# Patient Record
Sex: Female | Born: 1985 | Hispanic: Yes | Marital: Married | Smoking: Never smoker
Health system: Southern US, Community
[De-identification: ages and names within clinical notes are randomized; demographics above are authoritative.]

## PROBLEM LIST (undated history)

## (undated) ENCOUNTER — Emergency Department: Admission: EM | Payer: PRIVATE HEALTH INSURANCE | Source: Home / Self Care

## (undated) DIAGNOSIS — I10 Essential (primary) hypertension: Secondary | ICD-10-CM

## (undated) HISTORY — PX: NO PAST SURGERIES: SHX2092

---

## 2007-08-21 ENCOUNTER — Other Ambulatory Visit: Payer: Self-pay

## 2007-08-21 ENCOUNTER — Emergency Department: Payer: Self-pay | Admitting: Emergency Medicine

## 2008-02-07 ENCOUNTER — Emergency Department: Payer: Self-pay | Admitting: Emergency Medicine

## 2008-02-09 ENCOUNTER — Ambulatory Visit: Payer: Self-pay | Admitting: Emergency Medicine

## 2010-07-17 ENCOUNTER — Ambulatory Visit: Payer: Self-pay | Admitting: Family Medicine

## 2015-02-08 ENCOUNTER — Other Ambulatory Visit: Payer: Self-pay | Admitting: Physician Assistant

## 2015-02-08 DIAGNOSIS — B1920 Unspecified viral hepatitis C without hepatic coma: Secondary | ICD-10-CM

## 2015-02-16 ENCOUNTER — Other Ambulatory Visit: Payer: Self-pay | Admitting: Physician Assistant

## 2015-02-16 ENCOUNTER — Ambulatory Visit: Payer: No Typology Code available for payment source

## 2015-02-16 DIAGNOSIS — B1910 Unspecified viral hepatitis B without hepatic coma: Secondary | ICD-10-CM

## 2015-02-24 ENCOUNTER — Ambulatory Visit
Admission: RE | Admit: 2015-02-24 | Discharge: 2015-02-24 | Disposition: A | Discharge status: Home / Self Care | Payer: No Typology Code available for payment source | Source: Ambulatory Visit | Attending: Physician Assistant | Admitting: Physician Assistant

## 2015-02-24 DIAGNOSIS — B1910 Unspecified viral hepatitis B without hepatic coma: Secondary | ICD-10-CM | POA: Diagnosis present

## 2015-04-19 ENCOUNTER — Other Ambulatory Visit: Payer: Self-pay | Admitting: Physician Assistant

## 2015-04-19 DIAGNOSIS — M7522 Bicipital tendinitis, left shoulder: Secondary | ICD-10-CM

## 2015-04-19 DIAGNOSIS — S46812A Strain of other muscles, fascia and tendons at shoulder and upper arm level, left arm, initial encounter: Secondary | ICD-10-CM

## 2015-04-21 ENCOUNTER — Ambulatory Visit
Admission: RE | Admit: 2015-04-21 | Discharge: 2015-04-21 | Disposition: A | Discharge status: Home / Self Care | Payer: No Typology Code available for payment source | Source: Ambulatory Visit | Attending: Physician Assistant | Admitting: Physician Assistant

## 2015-04-21 DIAGNOSIS — M25512 Pain in left shoulder: Secondary | ICD-10-CM | POA: Diagnosis present

## 2015-04-21 DIAGNOSIS — M7522 Bicipital tendinitis, left shoulder: Secondary | ICD-10-CM

## 2015-04-21 DIAGNOSIS — M67814 Other specified disorders of tendon, left shoulder: Secondary | ICD-10-CM | POA: Insufficient documentation

## 2015-04-21 DIAGNOSIS — M25412 Effusion, left shoulder: Secondary | ICD-10-CM | POA: Insufficient documentation

## 2015-04-21 DIAGNOSIS — S46812A Strain of other muscles, fascia and tendons at shoulder and upper arm level, left arm, initial encounter: Secondary | ICD-10-CM

## 2016-04-26 ENCOUNTER — Other Ambulatory Visit: Payer: Self-pay | Admitting: Student

## 2016-04-26 DIAGNOSIS — M5412 Radiculopathy, cervical region: Secondary | ICD-10-CM

## 2016-05-08 ENCOUNTER — Ambulatory Visit
Admission: RE | Admit: 2016-05-08 | Discharge: 2016-05-08 | Disposition: A | Discharge status: Home / Self Care | Payer: PRIVATE HEALTH INSURANCE | Source: Ambulatory Visit | Attending: Student | Admitting: Student

## 2016-05-08 ENCOUNTER — Other Ambulatory Visit: Payer: No Typology Code available for payment source

## 2016-05-08 DIAGNOSIS — M5412 Radiculopathy, cervical region: Secondary | ICD-10-CM | POA: Insufficient documentation

## 2016-09-06 DIAGNOSIS — J111 Influenza due to unidentified influenza virus with other respiratory manifestations: Secondary | ICD-10-CM | POA: Insufficient documentation

## 2016-09-18 ENCOUNTER — Emergency Department
Admission: EM | Admit: 2016-09-18 | Discharge: 2016-09-18 | Disposition: A | Discharge status: Home / Self Care | Payer: PRIVATE HEALTH INSURANCE | Attending: Emergency Medicine | Admitting: Emergency Medicine

## 2016-09-18 ENCOUNTER — Encounter: Payer: Self-pay | Admitting: Radiology

## 2016-09-18 ENCOUNTER — Emergency Department: Payer: PRIVATE HEALTH INSURANCE

## 2016-09-18 DIAGNOSIS — R000 Tachycardia, unspecified: Secondary | ICD-10-CM | POA: Insufficient documentation

## 2016-09-18 DIAGNOSIS — E860 Dehydration: Secondary | ICD-10-CM | POA: Insufficient documentation

## 2016-09-18 DIAGNOSIS — F419 Anxiety disorder, unspecified: Secondary | ICD-10-CM | POA: Insufficient documentation

## 2016-09-18 DIAGNOSIS — R0602 Shortness of breath: Secondary | ICD-10-CM | POA: Insufficient documentation

## 2016-09-18 LAB — CHLAMYDIA/NGC RT PCR (ARMC ONLY)
Chlamydia Tr: NOT DETECTED
N gonorrhoeae: NOT DETECTED

## 2016-09-18 LAB — COMPREHENSIVE METABOLIC PANEL
ALT: 25 U/L (ref 14–54)
AST: 27 U/L (ref 15–41)
Albumin: 3.8 g/dL (ref 3.5–5.0)
Alkaline Phosphatase: 77 U/L (ref 38–126)
Anion gap: 7 (ref 5–15)
BUN: 9 mg/dL (ref 6–20)
CO2: 26 mmol/L (ref 22–32)
Calcium: 9.1 mg/dL (ref 8.9–10.3)
Chloride: 106 mmol/L (ref 101–111)
Creatinine, Ser: 0.56 mg/dL (ref 0.44–1.00)
GFR calc Af Amer: >60 mL/min (ref >60)
GFR calc non Af Amer: >60 mL/min (ref >60)
Glucose, Bld: 130 mg/dL — ABNORMAL HIGH (ref 65–99)
Potassium: 3.2 mmol/L — ABNORMAL LOW (ref 3.5–5.1)
Sodium: 139 mmol/L (ref 135–145)
Total Bilirubin: 0.5 mg/dL (ref 0.3–1.2)
Total Protein: 7 g/dL (ref 6.5–8.1)

## 2016-09-18 LAB — WET PREP, GENITAL
Clue Cells Wet Prep HPF POC: NONE SEEN
Sperm: NONE SEEN
Trich, Wet Prep: NONE SEEN
Yeast Wet Prep HPF POC: NONE SEEN

## 2016-09-18 LAB — URINALYSIS, COMPLETE (UACMP) WITH MICROSCOPIC
Bacteria, UA: NONE SEEN
Bilirubin Urine: NEGATIVE
Glucose, UA: NEGATIVE mg/dL
Hgb urine dipstick: NEGATIVE
Ketones, ur: NEGATIVE mg/dL
Nitrite: NEGATIVE
Protein, ur: NEGATIVE mg/dL
Specific Gravity, Urine: 1.001 — ABNORMAL LOW (ref 1.005–1.030)
pH: 7 (ref 5.0–8.0)

## 2016-09-18 LAB — CBC WITH DIFFERENTIAL/PLATELET
Basophils Absolute: 0 10*3/uL (ref 0–0.1)
Basophils Relative: 0 %
Eosinophils Absolute: 0.1 10*3/uL (ref 0–0.7)
Eosinophils Relative: 1 %
HCT: 40 % (ref 35.0–47.0)
Hemoglobin: 13.6 g/dL (ref 12.0–16.0)
Lymphocytes Relative: 8 %
Lymphs Abs: 0.9 10*3/uL — ABNORMAL LOW (ref 1.0–3.6)
MCH: 27.7 pg (ref 26.0–34.0)
MCHC: 34 g/dL (ref 32.0–36.0)
MCV: 81.4 fL (ref 80.0–100.0)
Monocytes Absolute: 0.7 10*3/uL (ref 0.2–0.9)
Monocytes Relative: 6 %
Neutro Abs: 10.2 10*3/uL — ABNORMAL HIGH (ref 1.4–6.5)
Neutrophils Relative %: 85 %
Platelets: 348 10*3/uL (ref 150–440)
RBC: 4.91 MIL/uL (ref 3.80–5.20)
RDW: 13.2 % (ref 11.5–14.5)
WBC: 12 10*3/uL — ABNORMAL HIGH (ref 3.6–11.0)

## 2016-09-18 LAB — POCT PREGNANCY, URINE: Preg Test, Ur: NEGATIVE

## 2016-09-18 LAB — TROPONIN I: Troponin I: <0.03 ng/mL (ref <0.03)

## 2016-09-18 MED ORDER — LORAZEPAM 2 MG/ML IJ SOLN
1.0000 mg | Freq: Once | INTRAMUSCULAR | Status: AC
  Administered 2016-09-18: 1 mg via INTRAVENOUS
  Filled 2016-09-18: qty 1

## 2016-09-18 MED ORDER — LORAZEPAM 1 MG PO TABS
ORAL_TABLET | ORAL | Status: AC
  Filled 2016-09-18: qty 1

## 2016-09-18 MED ORDER — POTASSIUM CHLORIDE CRYS ER 20 MEQ PO TBCR
40.0000 meq | EXTENDED_RELEASE_TABLET | Freq: Once | ORAL | Status: AC
  Administered 2016-09-18: 40 meq via ORAL
  Filled 2016-09-18: qty 2

## 2016-09-18 MED ORDER — LORAZEPAM 1 MG PO TABS
1.0000 mg | ORAL_TABLET | Freq: Once | ORAL | Status: AC
  Administered 2016-09-18: 1 mg via ORAL

## 2016-09-18 MED ORDER — IOPAMIDOL (ISOVUE-370) INJECTION 76%
75.0000 mL | Freq: Once | INTRAVENOUS | Status: AC | PRN
  Administered 2016-09-18: 75 mL via INTRAVENOUS

## 2016-09-18 NOTE — ED Notes (Signed)
Pt presents with sob 2 weeks after being diagnosed with flu. Pt states her HR was high at [facility] when she was diagnosed and she was given 4 bags of fluids for dehydration. Pt states she is now having LLQ pain and feels like her heart is racing. Pt reports n/v x 3 days and that she has not been eating; po fluids are staying down. Pt also c/o shoulder pain and has recent diagnosis of tendonitis.

## 2016-09-18 NOTE — ED Notes (Signed)
Pt discharged home after verbalizing understanding of discharge instructions; nad noted. Interpreter [place] provided service for this discharge.;

## 2016-09-18 NOTE — ED Triage Notes (Signed)
Patient to ER with c/o shortness of breath. Patient was diagnosed with flu 2 weeks ago. Reports increased weakness currently. Also c/o left shoulder "heaviness". Also received diagnosis of tendonitis in that area recently.

## 2016-09-18 NOTE — ED Provider Notes (Addendum)
[facility] Emergency Department Provider Note ____________________________________________   I have reviewed the triage vital signs and the triage nursing note.  HISTORY  Chief Complaint Shortness of Breath   Historian Patient  HPI Darien Ramusna Edman CircleCruz Diaz is a 31 y.o. female presents today with heart racing.  She states that 2 days ago she was vomiting all day long, one episode of vomiting yesterday, she has persistent heart racing. She has some mild shortness of breath, and it feels like when she was diagnosed with dehydration a few weeks ago. At that point she was diagnosed with the flu and required 4 L of fluid. She feels like her respiratory symptoms are actually much improved, although she does still have some mild shortness of breath associated with the sensation of heart racing.  She is also complaining of midepigastric pain as well as lower suprapubic pain. She is having some frequency and mild burning with urination and is concerned she might have a urinary tract infection. She has no back pain.    History reviewed. No pertinent past medical history. Recent diagnosed for flu 2 weeks ago.  There are no active problems to display for this patient.   No past surgical history on file.  Prior to Admission medications   Not on File    Not on File  No family history on file.  Social History Social History  Substance Use Topics  . Smoking status: Not on file  . Smokeless tobacco: Not on file  . Alcohol use Not on file    Review of Systems  Constitutional: Negative for fever. Eyes: Negative for visual changes. ENT: Negative for sore throat. Cardiovascular: Some chest pressure and feeling of palpitations. Respiratory: No shortness of breath, no pleuritic chest pain, positive for palpitations/heart racing. Gastrointestinal: As per history of present illness. No hematemesis, no bloody stool. Genitourinary: Negative for hematuria. Musculoskeletal:  Negative for back pain. Skin: Negative for rash. Neurological: Negative for headache. 10 point Review of Systems otherwise negative ____________________________________________   PHYSICAL EXAM:  VITAL SIGNS: ED Triage Vitals [09/18/16 1244]  Enc Vitals Group     BP 128/83     Pulse Rate (!) 134     Resp 14     Temp 98.1 F (36.7 C)     Temp Source Oral     SpO2 99 %     Weight 170 lb (77.1 kg)     Height      Head Circumference      Peak Flow      Pain Score      Pain Loc      Pain Edu?      Excl. in GC?      Constitutional: Alert and oriented. Lying her side, also 2 cm well, but no acute distress. HEENT   Head: Normocephalic and atraumatic.      Eyes: Conjunctivae are normal. PERRL. Normal extraocular movements.      Ears:         Nose: No congestion/rhinnorhea.   Mouth/Throat: Mucous membranes are mildly dry.   Neck: No stridor. Cardiovascular/Chest:Tachycardic, regular rhythm.  No murmurs, rubs, or gallops. Respiratory: Normal respiratory effort without tachypnea nor retractions. Breath sounds are clear and equal bilaterally. No wheezes/rales/rhonchi. Gastrointestinal: Soft. No distention, no guarding, no rebound. Mild tenderness in the epigastrium and mild suprapubic tenderness  Genitourinary/rectal: No bleeding or discharge. Nontender cervix. Nontender adnexa and pelvis. Musculoskeletal: Nontender with normal range of motion in all extremities. No joint effusions.  No lower  extremity tenderness.  No edema. Neurologic:  Normal speech and language. No gross or focal neurologic deficits are appreciated. Skin:  Skin is warm, dry and intact. No rash noted. Psychiatric: Mood and affect are normal. Speech and behavior are normal. Patient exhibits appropriate insight and judgment.   ____________________________________________  LABS (pertinent positives/negatives)  Labs Reviewed  WET PREP, GENITAL - Abnormal; Notable for the following:       Result Value    WBC, Wet Prep HPF POC FEW (*)    All other components within normal limits  COMPREHENSIVE METABOLIC PANEL - Abnormal; Notable for the following:    Potassium 3.2 (*)    Glucose, Bld 130 (*)    All other components within normal limits  CBC WITH DIFFERENTIAL/PLATELET - Abnormal; Notable for the following:    WBC 12.0 (*)    Neutro Abs 10.2 (*)    Lymphs Abs 0.9 (*)    All other components within normal limits  URINALYSIS, COMPLETE (UACMP) WITH MICROSCOPIC - Abnormal; Notable for the following:    Color, Urine COLORLESS (*)    APPearance CLEAR (*)    Specific Gravity, Urine 1.001 (*)    Leukocytes, UA TRACE (*)    Squamous Epithelial / LPF 0-5 (*)    All other components within normal limits  URINE CULTURE  CULTURE, BLOOD (ROUTINE X 2)  CULTURE, BLOOD (ROUTINE X 2)  CHLAMYDIA/NGC RT PCR ([facility] ONLY)  TROPONIN I  POC URINE PREG, ED  POCT PREGNANCY, URINE    ____________________________________________    EKG I, Governor Rooks, MD, the attending physician have personally viewed and interpreted all ECGs.  135 bpm. Sinus tachycardia. Narrow QRS. Normal axis. Normal ST and T-wave  Repeat EKG 121 sinus tachycardia. Narrow QRS normal axis. Nonspecific ST-T wave ____________________________________________  RADIOLOGY All Xrays were viewed by me. Imaging interpreted by Radiologist.  Chest x-ray two-view:  IMPRESSION: Minimal bronchitic changes without infiltrate.  Chest CT for PE angio: IMPRESSION: No acute findings. No pulmonary embolism. No aortic aneurysm or dissection. No pneumonia. __________________________________________  PROCEDURES  Procedure(s) performed: None  Critical Care performed: None  ____________________________________________   ED COURSE / ASSESSMENT AND PLAN  Pertinent labs & imaging results that were available during my care of the patient were reviewed by me and considered in my medical decision making (see chart for details).   Ms.  Edman Circle is here for what appears to be clinical dehydration after vomiting for 2 days now with tachycardia. She is also reporting symptoms of UTI possibly gastritis.  Patient and family state that she is also quite anxious and stressed after family member recently diagnosed with cancer.  On reexamination, no abdominal pain, does not want further evaluation for that.  I did recommend pelvic, and no evidence for cervicitis and nontender.  Still tachycardic, discussed risk and benefit for ct chest to r/o pe -- chose to proceed.  Also patient now states she thinks anxiety /panic attack is the most likely cause was going on. I am going to give her both an IV fluid bolus, as well as Ativan.  Patient care transferred to Dr. Sharma Covert at shift change 3 PM. CT chest pending.    CONSULTATIONS:   None   Patient / Family / Caregiver informed of clinical course, medical decision-making process, and agree with plan.  Addended to include, at 3:10 AM I reviewed the chest CT which is reassuring.  Patient to receive fluids and Ativan, recheck vitals, anticipate likely discharged today.   ___________________________________________  FINAL CLINICAL IMPRESSION(S) / ED DIAGNOSES   Final diagnoses:  Dehydration  Anxiety  Sinus tachycardia              Note: This dictation was prepared with Dragon dictation. Any transcriptional errors that result from this process are unintentional    Governor Rooks, MD 09/18/16 1500    Governor Rooks, MD 09/18/16 [phone]

## 2016-09-19 ENCOUNTER — Encounter: Payer: Self-pay | Admitting: Emergency Medicine

## 2016-09-20 LAB — URINE CULTURE: Culture: NO GROWTH

## 2016-09-23 LAB — CULTURE, BLOOD (ROUTINE X 2)
Culture: NO GROWTH
Culture: NO GROWTH

## 2017-06-09 DIAGNOSIS — M7522 Bicipital tendinitis, left shoulder: Secondary | ICD-10-CM | POA: Insufficient documentation

## 2017-06-09 DIAGNOSIS — M7582 Other shoulder lesions, left shoulder: Secondary | ICD-10-CM | POA: Insufficient documentation

## 2017-06-10 ENCOUNTER — Other Ambulatory Visit: Payer: Self-pay | Admitting: Surgery

## 2017-06-12 ENCOUNTER — Other Ambulatory Visit: Payer: Self-pay | Admitting: Surgery

## 2017-06-12 DIAGNOSIS — M7582 Other shoulder lesions, left shoulder: Secondary | ICD-10-CM

## 2017-06-12 DIAGNOSIS — M7522 Bicipital tendinitis, left shoulder: Secondary | ICD-10-CM

## 2017-06-19 ENCOUNTER — Ambulatory Visit
Admission: RE | Admit: 2017-06-19 | Discharge: 2017-06-19 | Disposition: A | Discharge status: Home / Self Care | Payer: BLUE CROSS/BLUE SHIELD | Source: Ambulatory Visit | Attending: Surgery | Admitting: Surgery

## 2017-06-19 ENCOUNTER — Encounter: Payer: Self-pay | Admitting: Surgery

## 2017-06-19 DIAGNOSIS — M7582 Other shoulder lesions, left shoulder: Secondary | ICD-10-CM | POA: Insufficient documentation

## 2017-06-19 DIAGNOSIS — M7522 Bicipital tendinitis, left shoulder: Secondary | ICD-10-CM | POA: Insufficient documentation

## 2017-06-19 MED ORDER — IOPAMIDOL (ISOVUE-200) INJECTION 41%
15.0000 mL | Freq: Once | INTRAVENOUS | Status: AC | PRN
  Administered 2017-06-19: 15 mL
  Filled 2017-06-19: qty 50

## 2017-06-19 MED ORDER — LIDOCAINE HCL (PF) 1 % IJ SOLN
10.0000 mL | Freq: Once | INTRAMUSCULAR | Status: DC
  Filled 2017-06-19: qty 10

## 2017-06-19 MED ORDER — SODIUM CHLORIDE 0.9 % IJ SOLN: 10.0000 mL | Freq: Once | INTRAMUSCULAR | Status: DC

## 2017-06-19 MED ORDER — GADOBENATE DIMEGLUMINE 529 MG/ML IV SOLN
0.0500 mL | Freq: Once | INTRAVENOUS | Status: AC | PRN
  Administered 2017-06-19: 0.05 mL via INTRA_ARTICULAR

## 2017-06-23 DIAGNOSIS — S46912A Strain of unspecified muscle, fascia and tendon at shoulder and upper arm level, left arm, initial encounter: Secondary | ICD-10-CM | POA: Insufficient documentation

## 2017-08-08 ENCOUNTER — Encounter: Payer: Self-pay | Admitting: Emergency Medicine

## 2017-08-08 ENCOUNTER — Emergency Department: Payer: BLUE CROSS/BLUE SHIELD

## 2017-08-08 ENCOUNTER — Emergency Department
Admission: EM | Admit: 2017-08-08 | Discharge: 2017-08-08 | Disposition: A | Discharge status: Home / Self Care | Payer: BLUE CROSS/BLUE SHIELD | Attending: Emergency Medicine | Admitting: Emergency Medicine

## 2017-08-08 DIAGNOSIS — R05 Cough: Secondary | ICD-10-CM | POA: Diagnosis not present

## 2017-08-08 DIAGNOSIS — J040 Acute laryngitis: Secondary | ICD-10-CM | POA: Diagnosis not present

## 2017-08-08 DIAGNOSIS — R002 Palpitations: Secondary | ICD-10-CM | POA: Diagnosis not present

## 2017-08-08 DIAGNOSIS — J029 Acute pharyngitis, unspecified: Secondary | ICD-10-CM | POA: Diagnosis present

## 2017-08-08 DIAGNOSIS — R059 Cough, unspecified: Secondary | ICD-10-CM

## 2017-08-08 LAB — BASIC METABOLIC PANEL
Anion gap: 9 (ref 5–15)
BUN: 11 mg/dL (ref 6–20)
CO2: 25 mmol/L (ref 22–32)
Calcium: 9.2 mg/dL (ref 8.9–10.3)
Chloride: 106 mmol/L (ref 101–111)
Creatinine, Ser: 0.82 mg/dL (ref 0.44–1.00)
GFR calc Af Amer: >60 mL/min (ref >60)
GFR calc non Af Amer: >60 mL/min (ref >60)
Glucose, Bld: 130 mg/dL — ABNORMAL HIGH (ref 65–99)
Potassium: 3.6 mmol/L (ref 3.5–5.1)
Sodium: 140 mmol/L (ref 135–145)

## 2017-08-08 LAB — CBC
HCT: 39.2 % (ref 35.0–47.0)
Hemoglobin: 13 g/dL (ref 12.0–16.0)
MCH: 27.5 pg (ref 26.0–34.0)
MCHC: 33.2 g/dL (ref 32.0–36.0)
MCV: 83 fL (ref 80.0–100.0)
Platelets: 354 10*3/uL (ref 150–440)
RBC: 4.72 MIL/uL (ref 3.80–5.20)
RDW: 13.5 % (ref 11.5–14.5)
WBC: 12.7 10*3/uL — ABNORMAL HIGH (ref 3.6–11.0)

## 2017-08-08 LAB — TROPONIN I: Troponin I: <0.03 ng/mL (ref <0.03)

## 2017-08-08 MED ORDER — BENZONATATE 100 MG PO CAPS: 100.0000 mg | ORAL_CAPSULE | Freq: Four times a day (QID) | ORAL | 0 refills | Status: DC | PRN

## 2017-08-08 MED ORDER — ALBUTEROL SULFATE HFA 108 (90 BASE) MCG/ACT IN AERS: 2.0000 | INHALATION_SPRAY | RESPIRATORY_TRACT | 0 refills | Status: DC | PRN

## 2017-08-08 NOTE — ED Provider Notes (Signed)
[facility] Emergency Department Provider Note  ____________________________________________  Time seen: Approximately 5:39 PM  I have reviewed the triage vital signs and the nursing notes.   HISTORY  Chief Complaint Chest Pain    HPI Theresa Stevens is a 31 y.o. female, otherwise healthy, presenting with dry cough, sore throat, laryngitis and palpitations.  The patient reports that for the last several days, she has had a dry cough without any congestion or rhinorrhea, or ear pain.  She has developed a sore throat from her excessive cough.  She also reports that when she ambulates, she feels short of breath and developed some palpitations with chest tightness.  She has tried warm tea for her symptoms, which has not significantly helped.   History reviewed. No pertinent past medical history.  There are no active problems to display for this patient.   History reviewed. No pertinent surgical history.  Current Outpatient Rx  . Order #: [id number] Class: Print  . Order #: [id number] Class: Print    Allergies Patient has no known allergies.  No family history on file.  Social History Social History   Tobacco Use  . Smoking status: Never Smoker  . Smokeless tobacco: Never Used  Substance Use Topics  . Alcohol use: No  . Drug use: No    Review of Systems Constitutional: No fever/chills.  No lightheadedness or syncope. Eyes: No visual changes.  No eye discharge. ENT: Positive sore throat. No congestion or rhinorrhea. Cardiovascular: Positive chest tightness. Denies palpitations. Respiratory: Positive shortness of breath with exertion.  Positive nonproductive cough. Gastrointestinal: No abdominal pain.  No nausea, no vomiting.  No diarrhea.  No constipation. Genitourinary: Negative for dysuria. Musculoskeletal: Negative for back pain.  No lower extremity swelling or calf pain. Skin: Negative for rash. Neurological: Negative for headaches. No focal  numbness, tingling or weakness.     ____________________________________________   PHYSICAL EXAM:  VITAL SIGNS: ED Triage Vitals  Enc Vitals Group     BP 08/08/17 1632 109/75     Pulse Rate 08/08/17 1632 (!) 118     Resp 08/08/17 1632 20     Temp 08/08/17 1632 98.1 F (36.7 C)     Temp Source 08/08/17 1632 Oral     SpO2 08/08/17 1632 98 %     Weight 08/08/17 1633 185 lb (83.9 kg)     Height 08/08/17 1633 5\' 1"  (1.549 m)     Head Circumference --      Peak Flow --      Pain Score 08/08/17 1632 8     Pain Loc --      Pain Edu? --      Excl. in GC? --     Constitutional: Alert and oriented. Well appearing and in no acute distress. Answers questions appropriately.  The patient does have decreased phonation consistent with laryngitis. Eyes: Conjunctivae are normal.  EOMI. No scleral icterus.  No eye discharge. EARS: TMs are without fluid, bulge, or erythema.  The canals are clear as well. Head: Atraumatic. Nose: No congestion/rhinnorhea. Mouth/Throat: Mucous membranes are moist.  No posterior pharyngeal erythema, tonsillar swelling or exudate.  The posterior palate is symmetric and the uvula is midline.  No drooling.  Neck: No stridor.  Supple.  No JVD.  No meningismus. Cardiovascular: Normal rate, regular rhythm. No murmurs, rubs or gallops.  Respiratory: Normal respiratory effort.  No accessory muscle use or retractions. Lungs CTAB.  No wheezes, rales or ronchi. Gastrointestinal: Soft, nontender and nondistended.  No guarding  or rebound.  No peritoneal signs. Musculoskeletal: No LE edema. No ttp in the calves or palpable cords.  Negative Homan's sign. Neurologic:  A&Ox3.  Speech is clear.  Face and smile are symmetric.  EOMI.  Moves all extremities well. Skin:  Skin is warm, dry and intact. No rash noted. Psychiatric: Mood and affect are normal. Speech and behavior are normal.  Normal judgement.  ____________________________________________   LABS (all labs ordered are  listed, but only abnormal results are displayed)  Labs Reviewed  BASIC METABOLIC PANEL - Abnormal; Notable for the following components:      Result Value   Glucose, Bld 130 (*)    All other components within normal limits  CBC - Abnormal; Notable for the following components:   WBC 12.7 (*)    All other components within normal limits  TROPONIN I  POC URINE PREG, ED   ____________________________________________  EKG  ED ECG REPORT I, Rockne MenghiniNorman, Anne-Caroline, the attending physician, personally viewed and interpreted this ECG.   Date: 08/08/2017  EKG Time: 1627  Rate: 110  Rhythm: sinus tachycardia  Axis: normal  Intervals:none  ST&T Change: No STEMI  ____________________________________________  RADIOLOGY  Dg Chest 2 View  Result Date: 08/08/2017 CLINICAL DATA:  Coughing, chest pain, and shortness of breath. EXAM: CHEST  2 VIEW COMPARISON:  Chest x-ray dated September 18, 2016. FINDINGS: The heart size and mediastinal contours are within normal limits. Both lungs are clear. The visualized skeletal structures are unremarkable. IMPRESSION: No active cardiopulmonary disease. Electronically Signed   By: Obie DredgeWilliam T Derry M.D.   On: 08/08/2017 17:01    ____________________________________________   PROCEDURES  Procedure(s) performed: None  Procedures  Critical Care performed: No ____________________________________________   INITIAL IMPRESSION / ASSESSMENT AND PLAN / ED COURSE  Pertinent labs & imaging results that were available during my care of the patient were reviewed by me and considered in my medical decision making (see chart for details).  31 y.o. female with several days of dry cough, resulting sore throat, with exertional chest tightness, shortness of breath and palpitations.  Overall, the patient does have sinus tachycardia on her EKG and will recheck her heart rate here.  I do not see any evidence of a life-threatening arrhythmia.  The patient has normal  pulmonary examination and no evidence of pneumonia on her chest x-ray.  Today, the patient's symptoms are most likely from a laryngitis, and respiratory infection, which are most likely viral.  I will plan to treat her symptomatically, with Tessalon Perles and an albuterol MDI that she can have her coughing spasms.  And acute cardiac emergency, including ACS or MI, is very unlikely.  At this time, I have discussed return precautions as well as follow-up instructions with the patient who demonstrates understanding.    ____________________________________________  FINAL CLINICAL IMPRESSION(S) / ED DIAGNOSES  Final diagnoses:  Laryngitis  Cough  Sore throat  Palpitations         NEW MEDICATIONS STARTED DURING THIS VISIT:  This SmartLink is deprecated. Use AVSMEDLIST instead to display the medication list for a patient.    Rockne MenghiniNorman, Anne-Caroline, MD 08/08/17 1744

## 2017-08-08 NOTE — ED Triage Notes (Signed)
Patient presents to ED via POV from home with c/o CP and cough x 3 days. Patient also reports palpitations. Denies nausea. Dry, non productive cough noted.

## 2017-08-08 NOTE — Discharge Instructions (Signed)
Ud. Puede tomar las pastillas de Tessalon Perles o el Albuterol cuando tiene tos.  Tome Tylenol or Motrin para dolor.  Vuelva a la sala de emergencia para dolor, dificultad con su respiracion, dolor del pecho, o por cualquier otra cosa que BJ'sle moleste.

## 2017-08-08 NOTE — ED Notes (Signed)
NAD noted at time of D/C. Pt denies questions or concerns. Pt ambulatory to the lobby at this time.  

## 2018-06-08 ENCOUNTER — Encounter (INDEPENDENT_AMBULATORY_CARE_PROVIDER_SITE_OTHER): Payer: BLUE CROSS/BLUE SHIELD | Admitting: Vascular Surgery

## 2018-06-16 ENCOUNTER — Encounter (INDEPENDENT_AMBULATORY_CARE_PROVIDER_SITE_OTHER): Payer: BLUE CROSS/BLUE SHIELD | Admitting: Vascular Surgery

## 2018-06-24 ENCOUNTER — Encounter (INDEPENDENT_AMBULATORY_CARE_PROVIDER_SITE_OTHER): Payer: Self-pay | Admitting: Vascular Surgery

## 2018-10-28 ENCOUNTER — Other Ambulatory Visit (HOSPITAL_COMMUNITY): Payer: Self-pay | Admitting: Student

## 2018-10-28 ENCOUNTER — Other Ambulatory Visit: Payer: Self-pay | Admitting: Student

## 2018-10-28 DIAGNOSIS — B181 Chronic viral hepatitis B without delta-agent: Secondary | ICD-10-CM

## 2018-11-03 ENCOUNTER — Ambulatory Visit
Admission: RE | Admit: 2018-11-03 | Discharge: 2018-11-03 | Disposition: A | Discharge status: Home / Self Care | Payer: BLUE CROSS/BLUE SHIELD | Source: Ambulatory Visit | Attending: Student | Admitting: Student

## 2018-11-03 ENCOUNTER — Other Ambulatory Visit: Payer: Self-pay

## 2018-11-03 DIAGNOSIS — B181 Chronic viral hepatitis B without delta-agent: Secondary | ICD-10-CM | POA: Diagnosis not present

## 2019-04-29 DIAGNOSIS — B181 Chronic viral hepatitis B without delta-agent: Secondary | ICD-10-CM | POA: Insufficient documentation

## 2019-06-22 ENCOUNTER — Other Ambulatory Visit: Payer: Self-pay | Admitting: Family Medicine

## 2019-06-22 ENCOUNTER — Ambulatory Visit
Admission: RE | Admit: 2019-06-22 | Discharge: 2019-06-22 | Disposition: A | Discharge status: Home / Self Care | Payer: BC Managed Care – PPO | Source: Ambulatory Visit | Attending: Family Medicine | Admitting: Family Medicine

## 2019-06-22 DIAGNOSIS — M25562 Pain in left knee: Secondary | ICD-10-CM

## 2019-06-22 DIAGNOSIS — W19XXXA Unspecified fall, initial encounter: Secondary | ICD-10-CM

## 2020-04-30 IMAGING — US US ABDOMEN COMPLETE
1 series · 14 of 25 positions shown · non-contrast
Comparison: Chest CTA 09/18/2016

CLINICAL DATA: 32-year-old female with chronic hepatitis B.

EXAM:
ABDOMEN ULTRASOUND COMPLETE

[Series 1: us abdomen complete · 0.19mm/px · 14 of 85 slices shown]
[im 1/85]
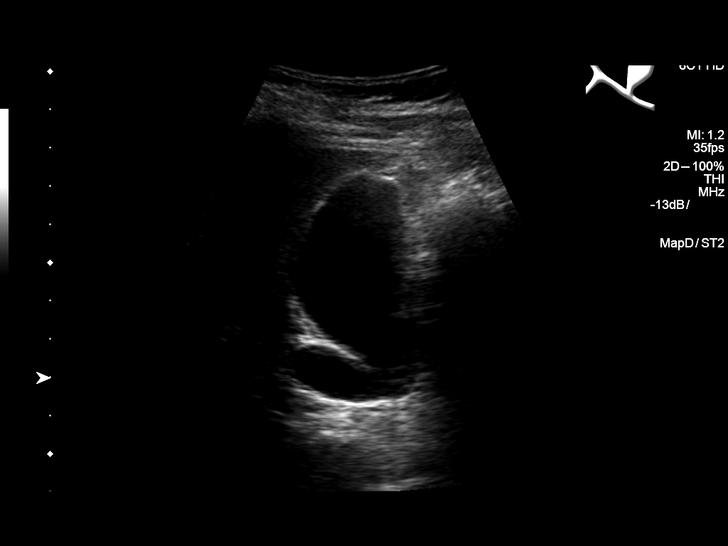
[im 8/85]
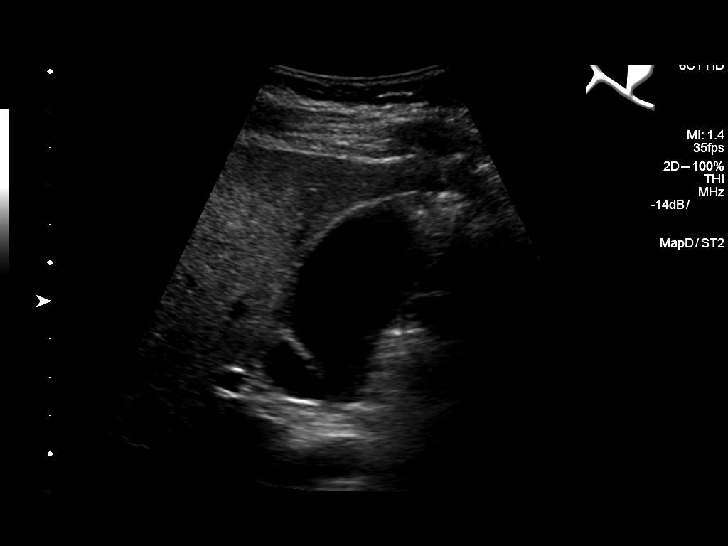
[im 15/85]
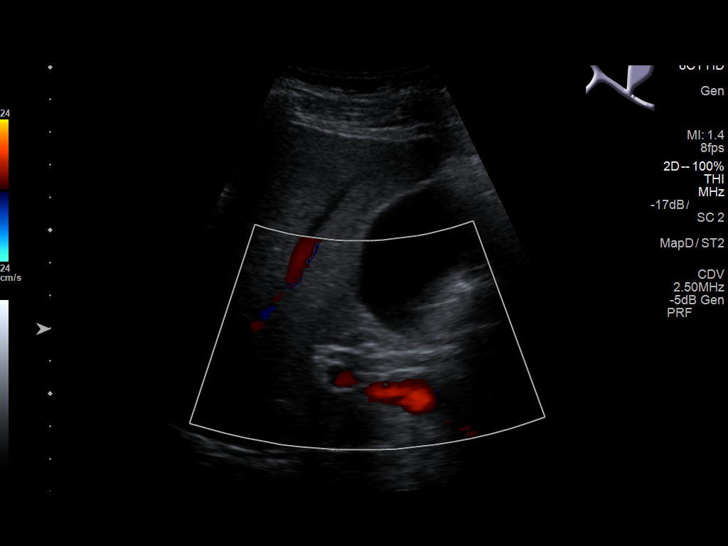
[im 22/85]
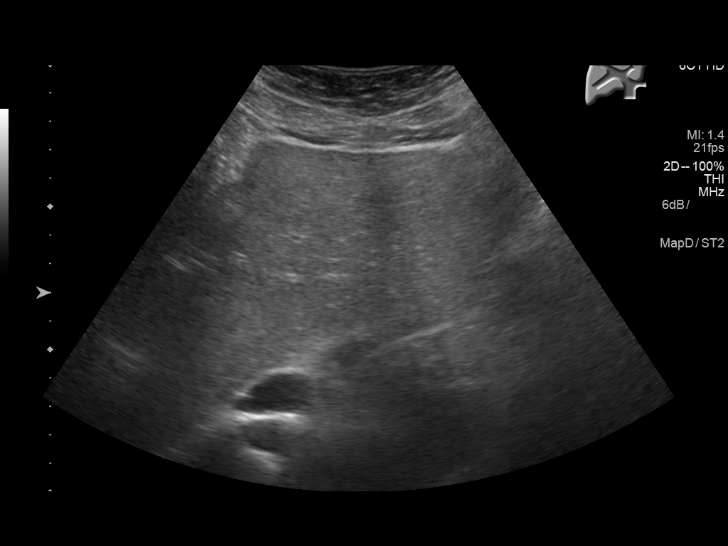
[im 29/85]
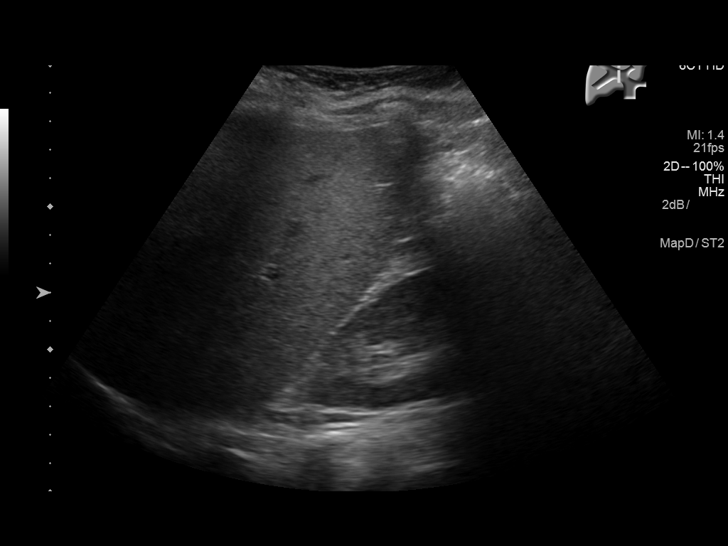
[im 32/85]
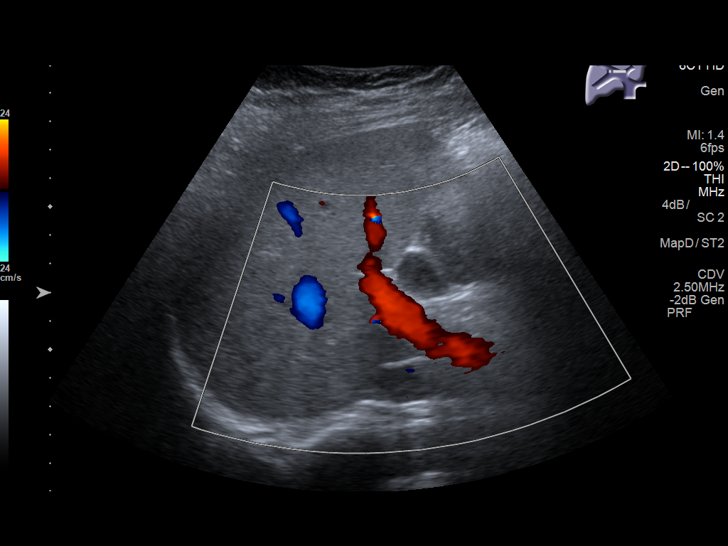
[im 39/85]
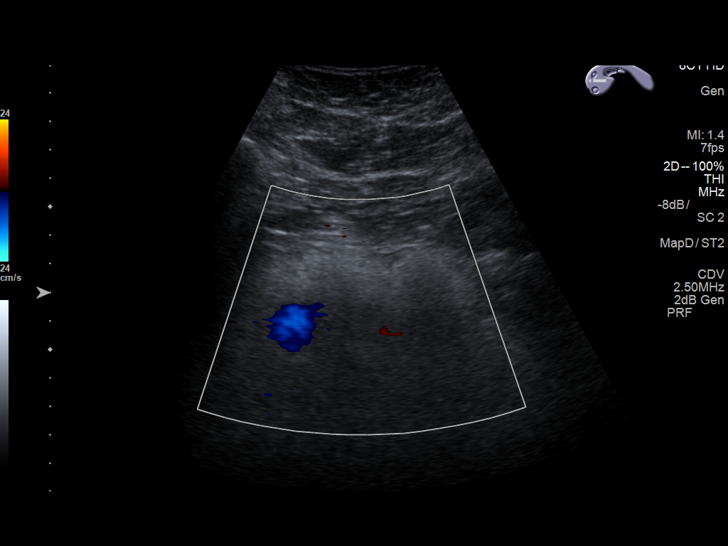
[im 46/85]
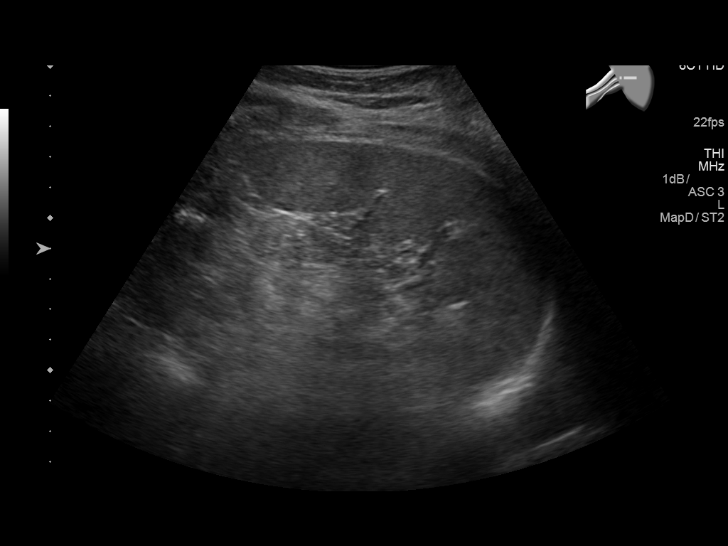
[im 53/85]
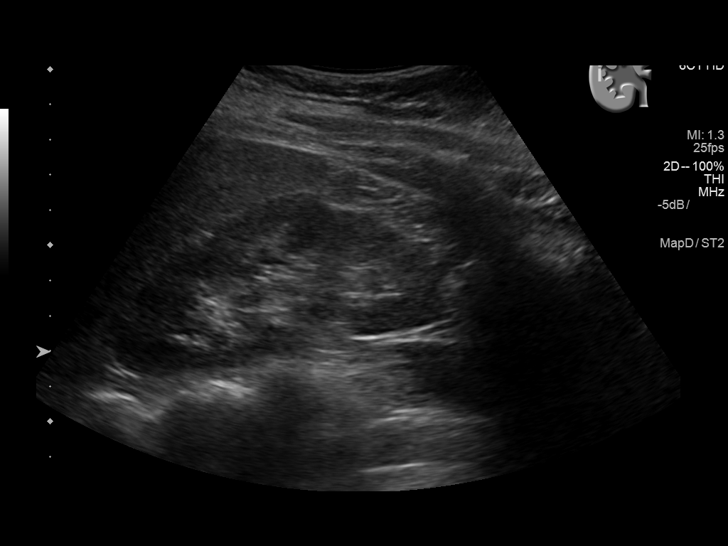
[im 57/85]
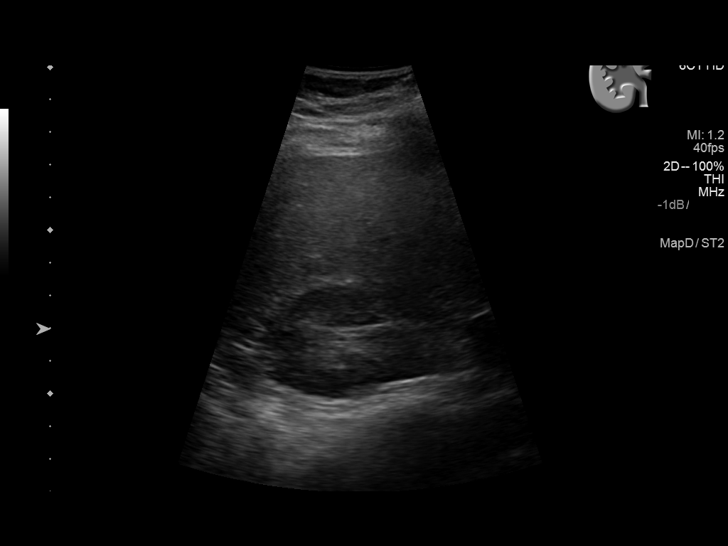
[im 64/85]
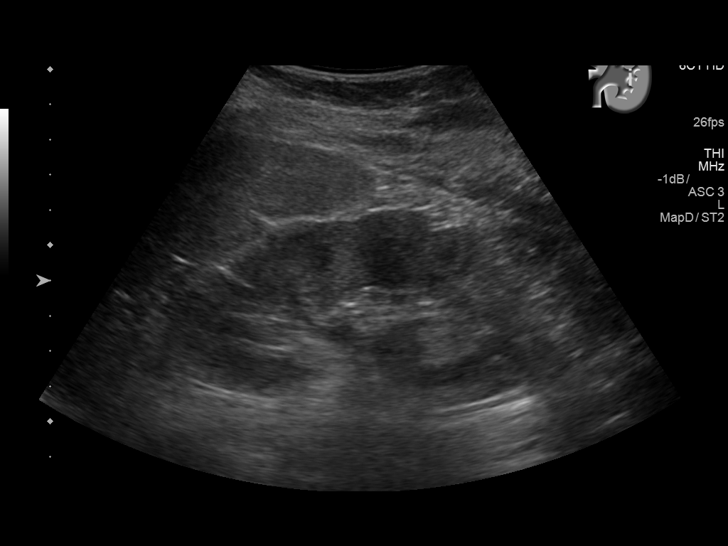
[im 71/85]
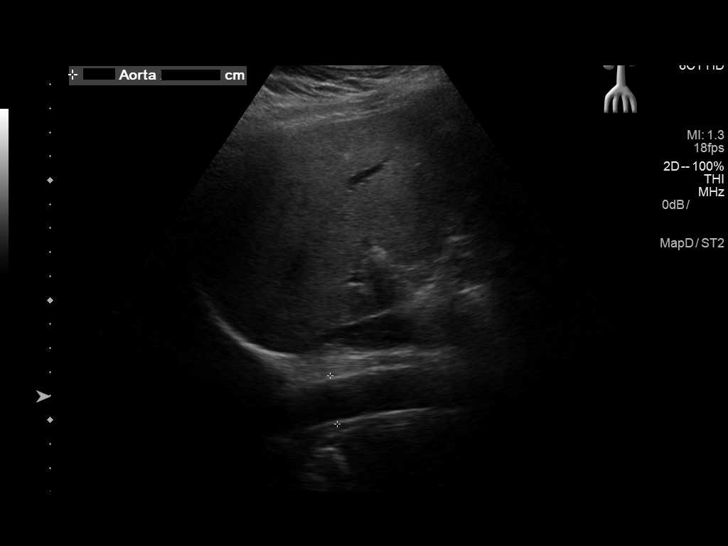
[im 78/85]
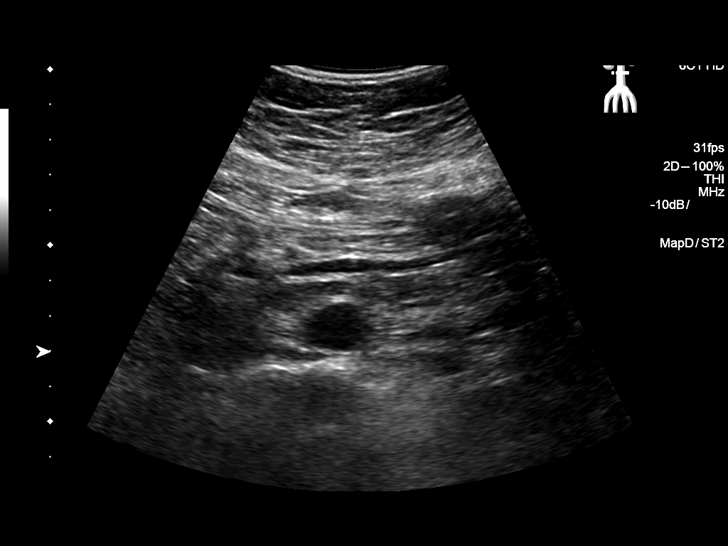
[im 85/85]
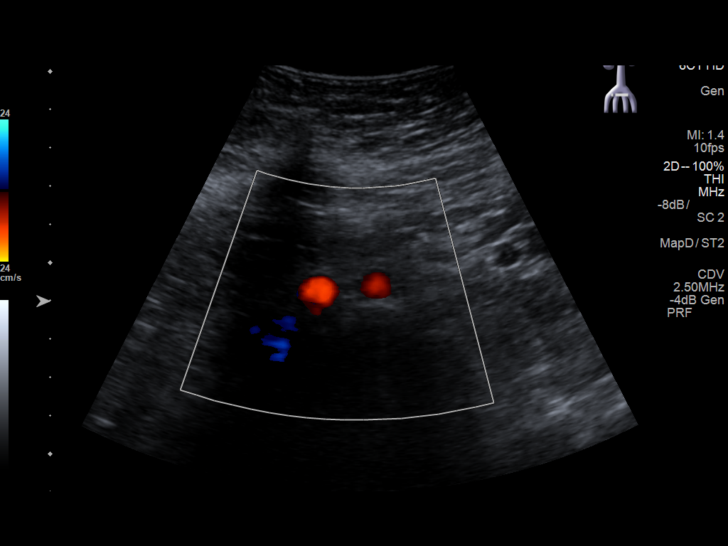

[14 of 25 positions shown; findings below may reference images not displayed]

FINDINGS: Gallbladder: No gallstones or wall thickening visualized. No
sonographic Murphy sign noted by sonographer.

Common bile duct: Diameter: 3 millimeters, normal.

Liver: Liver echogenicity is mildly increased (image 30). No
discrete liver lesion. No intrahepatic biliary ductal dilatation.
Portal vein is patent on color Doppler imaging with normal direction
of blood flow towards the liver.

IVC: No abnormality visualized.

Pancreas: Visualized portion unremarkable.

Spleen: Size and appearance within normal limits.

Right Kidney: Length: 10.4 centimeters. Echogenicity within normal
limits. No mass or hydronephrosis visualized.

Left Kidney: Length: 10.7 centimeters. Evidence of shadowing left
upper pole nephrolithiasis (image 60).. Echogenicity within normal
limits. No mass or hydronephrosis visualized.

Abdominal aorta: No aneurysm visualized.

Other findings: None.
IMPRESSION: 1. Negative ultrasound appearance of the liver aside from mildly
increased echogenicity.
2. Left nephrolithiasis suspected.

## 2020-05-01 ENCOUNTER — Other Ambulatory Visit: Payer: Self-pay | Admitting: Physician Assistant

## 2020-05-01 DIAGNOSIS — Z3482 Encounter for supervision of other normal pregnancy, second trimester: Secondary | ICD-10-CM

## 2020-05-09 ENCOUNTER — Ambulatory Visit
Admission: RE | Admit: 2020-05-09 | Discharge: 2020-05-09 | Disposition: A | Discharge status: Home / Self Care | Payer: BC Managed Care – PPO | Source: Ambulatory Visit | Attending: Physician Assistant | Admitting: Physician Assistant

## 2020-05-09 ENCOUNTER — Other Ambulatory Visit: Payer: Self-pay

## 2020-05-09 DIAGNOSIS — Z3482 Encounter for supervision of other normal pregnancy, second trimester: Secondary | ICD-10-CM

## 2020-05-29 ENCOUNTER — Other Ambulatory Visit: Payer: Self-pay | Admitting: Physician Assistant

## 2020-05-29 DIAGNOSIS — G930 Cerebral cysts: Secondary | ICD-10-CM

## 2020-06-15 ENCOUNTER — Ambulatory Visit
Admission: RE | Admit: 2020-06-15 | Discharge: 2020-06-15 | Disposition: A | Discharge status: Home / Self Care | Payer: BC Managed Care – PPO | Source: Ambulatory Visit | Attending: Physician Assistant | Admitting: Physician Assistant

## 2020-06-15 ENCOUNTER — Other Ambulatory Visit: Payer: Self-pay

## 2020-06-15 DIAGNOSIS — G930 Cerebral cysts: Secondary | ICD-10-CM | POA: Insufficient documentation

## 2021-10-23 ENCOUNTER — Other Ambulatory Visit: Payer: Self-pay | Admitting: Family Medicine

## 2021-11-01 ENCOUNTER — Other Ambulatory Visit: Payer: Self-pay | Admitting: Family Medicine

## 2021-11-01 DIAGNOSIS — I83811 Varicose veins of right lower extremities with pain: Secondary | ICD-10-CM

## 2021-11-05 ENCOUNTER — Ambulatory Visit: Payer: Medicaid Other

## 2021-11-09 ENCOUNTER — Other Ambulatory Visit: Payer: Self-pay

## 2021-11-09 ENCOUNTER — Ambulatory Visit
Admission: RE | Admit: 2021-11-09 | Discharge: 2021-11-09 | Disposition: A | Discharge status: Home / Self Care | Payer: Medicaid Other | Source: Ambulatory Visit | Attending: Family Medicine | Admitting: Family Medicine

## 2021-11-09 DIAGNOSIS — I83811 Varicose veins of right lower extremities with pain: Secondary | ICD-10-CM | POA: Diagnosis present

## 2021-11-12 ENCOUNTER — Other Ambulatory Visit: Payer: Self-pay | Admitting: Gastroenterology

## 2021-11-12 DIAGNOSIS — B181 Chronic viral hepatitis B without delta-agent: Secondary | ICD-10-CM

## 2021-11-26 ENCOUNTER — Other Ambulatory Visit: Payer: Self-pay

## 2021-11-26 ENCOUNTER — Ambulatory Visit
Admission: RE | Admit: 2021-11-26 | Discharge: 2021-11-26 | Disposition: A | Discharge status: Home / Self Care | Payer: Medicaid Other | Source: Ambulatory Visit | Attending: Gastroenterology | Admitting: Gastroenterology

## 2021-11-26 DIAGNOSIS — B181 Chronic viral hepatitis B without delta-agent: Secondary | ICD-10-CM | POA: Insufficient documentation

## 2021-12-11 ENCOUNTER — Other Ambulatory Visit (INDEPENDENT_AMBULATORY_CARE_PROVIDER_SITE_OTHER): Payer: Self-pay | Admitting: Nurse Practitioner

## 2021-12-11 DIAGNOSIS — I83811 Varicose veins of right lower extremities with pain: Secondary | ICD-10-CM

## 2021-12-12 ENCOUNTER — Encounter (INDEPENDENT_AMBULATORY_CARE_PROVIDER_SITE_OTHER): Payer: BLUE CROSS/BLUE SHIELD | Admitting: Nurse Practitioner

## 2021-12-12 ENCOUNTER — Encounter (INDEPENDENT_AMBULATORY_CARE_PROVIDER_SITE_OTHER): Payer: BLUE CROSS/BLUE SHIELD

## 2022-05-20 ENCOUNTER — Encounter (INDEPENDENT_AMBULATORY_CARE_PROVIDER_SITE_OTHER): Payer: Self-pay | Admitting: Vascular Surgery

## 2022-05-20 ENCOUNTER — Ambulatory Visit (INDEPENDENT_AMBULATORY_CARE_PROVIDER_SITE_OTHER): Payer: Medicaid Other

## 2022-05-20 ENCOUNTER — Ambulatory Visit (INDEPENDENT_AMBULATORY_CARE_PROVIDER_SITE_OTHER): Payer: Medicaid Other | Admitting: Vascular Surgery

## 2022-05-20 DIAGNOSIS — I83819 Varicose veins of unspecified lower extremities with pain: Secondary | ICD-10-CM | POA: Diagnosis not present

## 2022-05-20 DIAGNOSIS — I83811 Varicose veins of right lower extremities with pain: Secondary | ICD-10-CM

## 2022-05-22 ENCOUNTER — Encounter (INDEPENDENT_AMBULATORY_CARE_PROVIDER_SITE_OTHER): Payer: Self-pay | Admitting: Vascular Surgery

## 2022-05-22 DIAGNOSIS — I83819 Varicose veins of unspecified lower extremities with pain: Secondary | ICD-10-CM | POA: Insufficient documentation

## 2022-05-22 NOTE — Progress Notes (Signed)
MRN : [id number]  Theresa Stevens is a 36 y.o. ([birth year]) female who presents with chief complaint of varicose veins hurt.  History of Present Illness: The patient is seen for evaluation of symptomatic varicose veins. The patient relates burning and stinging which worsened steadily throughout the course of the day, particularly with standing. The patient also notes an aching and throbbing pain over the varicosities, particularly with prolonged dependent positions. The symptoms are significantly improved with elevation.  The patient also notes that during hot weather the symptoms are greatly intensified. The patient states the pain from the varicose veins interferes with work, daily exercise, shopping and household maintenance. At this point, the symptoms are persistent and severe enough that they're having a negative impact on lifestyle and are interfering with daily activities.  There is no history of DVT, PE or superficial thrombophlebitis. There is no history of ulceration or hemorrhage. The patient denies a significant family history of varicose veins.  The patient has worn graduated compression many years. She states she has been exercising  on a routine basis.  At the present time the patient has been using over-the-counter analgesics. There is no history of prior surgical intervention or sclerotherapy.    No outpatient medications have been marked as taking for the 05/20/22 encounter (Office Visit) with Delana Meyer, Dolores Lory, MD.    No past medical history on file.  Past Surgical History:  Procedure Laterality Date   NO PAST SURGERIES      Social History Social History   Tobacco Use   Smoking status: Never   Smokeless tobacco: Never  Substance Use Topics   Alcohol use: No   Drug use: No    Family History Family History  Adopted: Yes    No Known Allergies   REVIEW OF SYSTEMS (Negative unless checked)  Constitutional: [] Weight loss  [] Fever  [] Chills Cardiac:  [] Chest pain   [] Chest pressure   [] Palpitations   [] Shortness of breath when laying flat   [] Shortness of breath with exertion. Vascular:  [] Pain in legs with walking   [x] Pain in legs with standing  [] History of DVT   [] Phlebitis   [] Swelling in legs   [x] Varicose veins   [] Non-healing ulcers Pulmonary:   [] Uses home oxygen   [] Productive cough   [] Hemoptysis   [] Wheeze  [] COPD   [] Asthma Neurologic:  [] Dizziness   [] Seizures   [] History of stroke   [] History of TIA  [] Aphasia   [] Vissual changes   [] Weakness or numbness in arm   [] Weakness or numbness in leg Musculoskeletal:   [] Joint swelling   [] Joint pain   [] Low back pain Hematologic:  [] Easy bruising  [] Easy bleeding   [] Hypercoagulable state   [] Anemic Gastrointestinal:  [] Diarrhea   [] Vomiting  [] Gastroesophageal reflux/heartburn   [] Difficulty swallowing. Genitourinary:  [] Chronic kidney disease   [] Difficult urination  [] Frequent urination   [] Blood in urine Skin:  [] Rashes   [] Ulcers  Psychological:  [] History of anxiety   []  History of major depression.  Physical Examination  Vitals:   05/20/22 1453  BP: 135/85  Pulse: 99  Resp: 16  Weight: 189 lb (85.7 kg)   Body mass index is 35.71 kg/m. Gen: WD/WN, NAD Head: [place]/AT, No temporalis wasting.  Ear/Nose/Throat: Hearing grossly intact, nares w/o erythema or drainage, pinna without lesions Eyes: PER, EOMI, sclera nonicteric.  Neck: Supple, no gross masses.  No JVD.  Pulmonary:  Good air movement, no audible wheezing, no use of accessory muscles.  Cardiac:  RRR, precordium not hyperdynamic. Vascular:  Large varicosities present, greater than 10 mm right leg.  Veins are tender to palpation  Moderate venous stasis changes to the legs bilaterally.  Trace soft pitting edema  Vessel Right Left  Radial Palpable Palpable  Gastrointestinal: soft, non-distended. No guarding/no peritoneal signs.  Musculoskeletal: M/S 5/5 throughout.  No deformity.  Neurologic: CN 2-12 intact. Pain and  light touch intact in extremities.  Symmetrical.  Speech is fluent. Motor exam as listed above. Psychiatric: Judgment intact, Mood & affect appropriate for pt's clinical situation. Dermatologic: Venous rashes no ulcers noted.  No changes consistent with cellulitis. Lymph : No lichenification or skin changes of chronic lymphedema.  CBC Lab Results  Component Value Date   WBC 12.7 (H) 08/08/2017   HGB 13.0 08/08/2017   HCT 39.2 08/08/2017   MCV 83.0 08/08/2017   PLT 354 08/08/2017    BMET    Component Value Date/Time   NA 140 08/08/2017 1632   K 3.6 08/08/2017 1632   CL 106 08/08/2017 1632   CO2 25 08/08/2017 1632   GLUCOSE 130 (H) 08/08/2017 1632   BUN 11 08/08/2017 1632   CREATININE 0.82 08/08/2017 1632   CALCIUM 9.2 08/08/2017 1632   GFRNONAA >60 08/08/2017 1632   GFRAA >60 08/08/2017 1632   CrCl cannot be calculated (Patient's most recent lab result is older than the maximum 21 days allowed.).  COAG No results found for: "INR", "PROTIME"  Radiology No results found.   Assessment/Plan 1. Varicose veins of right lower extremity with pain Recommend  I have reviewed my previous  discussion with the patient regarding  varicose veins and why they cause symptoms. Patient will continue  wearing graduated compression stockings class 1 on a daily basis, beginning first thing in the morning and removing them in the evening.    In addition, behavioral modification including elevation during the day was again discussed and this will continue.  The patient has utilized over the counter pain medications and has been exercising.  However, at this time conservative therapy has not alleviated the patient's symptoms of leg pain and swelling  Recommend: laser ablation of the right great saphenous vein to eliminate the symptoms of pain and swelling of the lower extremities caused by the severe superficial venous reflux disease.   - VAS [place] LOWER EXTREMITY VENOUS REFLUX    Hortencia Pilar, MD  05/22/2022 8:46 PM

## 2022-05-29 ENCOUNTER — Ambulatory Visit
Admission: EM | Admit: 2022-05-29 | Discharge: 2022-05-29 | Disposition: A | Discharge status: Home / Self Care | Payer: PRIVATE HEALTH INSURANCE

## 2022-05-29 ENCOUNTER — Encounter: Payer: Self-pay | Admitting: Emergency Medicine

## 2022-05-29 DIAGNOSIS — M542 Cervicalgia: Secondary | ICD-10-CM | POA: Diagnosis not present

## 2022-05-29 DIAGNOSIS — R112 Nausea with vomiting, unspecified: Secondary | ICD-10-CM

## 2022-05-29 DIAGNOSIS — M62838 Other muscle spasm: Secondary | ICD-10-CM | POA: Diagnosis not present

## 2022-05-29 DIAGNOSIS — R5383 Other fatigue: Secondary | ICD-10-CM

## 2022-05-29 DIAGNOSIS — R197 Diarrhea, unspecified: Secondary | ICD-10-CM

## 2022-05-29 HISTORY — DX: Essential (primary) hypertension: I10

## 2022-05-29 MED ORDER — ONDANSETRON HCL 4 MG PO TABS: 4.0000 mg | ORAL_TABLET | Freq: Three times a day (TID) | ORAL | 0 refills | Status: DC | PRN

## 2022-05-29 MED ORDER — CYCLOBENZAPRINE HCL 10 MG PO TABS: ORAL_TABLET | ORAL | 0 refills | Status: DC

## 2022-05-29 NOTE — ED Provider Notes (Signed)
[facility]    CSN: [id number] Arrival date & time: 05/29/22  1753      History   Chief Complaint Chief Complaint  Patient presents with   Dizziness   Emesis   Diarrhea   Headache    HPI Theresa Stevens is a 36 y.o. female.   36 year old female accompanied by her husband presents with multiple concerns. First is nausea, vomiting and diarrhea that started yesterday. Also has had a headache since yesterday. Occasional abdominal cramping which has mostly resolved today. No fever, dysuria, blood in her stool or unusual vaginal discharge. No known unusual foods. No other family members ill.   2nd concern is left sided neck and shoulder pain for over 1 month. She was seen by her PCP and thought to have a muscle strain and was prescribed anti-inflammatory medication with minimal relief. Pain will travel up back of head and contributes to headaches. No numbness or loss of motor control of arms or hands.   3rd concern is fatigue and dizziness. This has been occurring for many weeks. Does have a history of anemia. Did have blood work done by her PCP to evaluate thyroid dysfunction which was negative. Denies any chest pain, palpitations or shortness of breath. Has history of HTN. Currently takes Atenolol daily. No tobacco, alcohol or illicit drug use.   The history is provided by the patient and the spouse. The history is limited by a language barrier. A language interpreter was used Tax adviser Spanish interpreter used).    Past Medical History:  Diagnosis Date   Hypertension     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/2022   Chronic hepatitis B virus infection (HCC) 04/29/2019   Muscle strain of left scapular region 06/23/2017   Tendinitis of upper biceps tendon of left shoulder 06/09/2017   Rotator cuff tendinitis, left 06/09/2017   Influenza 09/06/2016    Past Surgical History:  Procedure Laterality Date   NO PAST SURGERIES       OB History   No obstetric history on file.      Home Medications    Prior to Admission medications   Medication Sig Start Date End Date Taking? Authorizing Provider  atenolol (TENORMIN) 25 MG tablet Take by mouth daily.   Yes [provider]  cyclobenzaprine (FLEXERIL) 10 MG tablet Take 1/2 to 1 whole tablet every 8 hours as needed for muscle spasms/pain 05/29/22  Yes Donnie Panik, Ali Lowe, NP  ondansetron (ZOFRAN) 4 MG tablet Take 1 tablet (4 mg total) by mouth every 8 (eight) hours as needed for nausea or vomiting. 05/29/22  Yes Ladan Vanderzanden, Ali Lowe, NP    Family History Family History  Adopted: Yes    Social History Social History   Tobacco Use   Smoking status: Never   Smokeless tobacco: Never  Vaping Use   Vaping Use: Never used  Substance Use Topics   Alcohol use: No   Drug use: No     Allergies   Patient has no known allergies.   Review of Systems Review of Systems  Constitutional:  Positive for activity change, appetite change and fatigue. Negative for diaphoresis and fever.  HENT:  Negative for mouth sores, sore throat and trouble swallowing.   Eyes:  Negative for photophobia and visual disturbance.  Respiratory:  Negative for shortness of breath and wheezing.   Cardiovascular:  Negative for chest pain and palpitations.  Gastrointestinal:  Positive for diarrhea, nausea  and vomiting. Negative for blood in stool.  Genitourinary:  Negative for decreased urine volume, difficulty urinating, dysuria, flank pain, frequency, hematuria and vaginal discharge.  Musculoskeletal:  Positive for myalgias and neck pain. Negative for arthralgias and neck stiffness.  Skin:  Negative for color change and rash.  Allergic/Immunologic: Negative for environmental allergies, food allergies and immunocompromised state.  Neurological:  Positive for dizziness, light-headedness and headaches. Negative for tremors, seizures, syncope, facial asymmetry, speech difficulty and numbness.   Hematological:  Negative for adenopathy. Does not bruise/bleed easily.     Physical Exam Triage Vital Signs ED Triage Vitals  Enc Vitals Group     BP 05/29/22 1824 118/78     Pulse Rate 05/29/22 1824 92     Resp 05/29/22 1824 16     Temp 05/29/22 1824 98.2 F (36.8 C)     Temp Source 05/29/22 1824 Oral     SpO2 05/29/22 1824 98 %     Weight --      Height --      Head Circumference --      Peak Flow --      Pain Score 05/29/22 1822 7     Pain Loc --      Pain Edu? --      Excl. in GC? --    No data found.  Updated Vital Signs BP 118/78 (BP Location: Right Arm)   Pulse 92   Temp 98.2 F (36.8 C) (Oral)   Resp 16   LMP 05/22/2022   SpO2 98%   Visual Acuity Right Eye Distance:   Left Eye Distance:   Bilateral Distance:    Right Eye Near:   Left Eye Near:    Bilateral Near:     Physical Exam Vitals and nursing note reviewed.  Constitutional:      General: She is awake. She is not in acute distress.    Appearance: She is well-developed and overweight.     Comments: She is partially lying back in the exam chair in no acute distress but appears tired and uncomfortable due to neck pain.   HENT:     Head: Normocephalic and atraumatic.     Jaw: There is normal jaw occlusion.     Right Ear: Hearing, tympanic membrane, ear canal and external ear normal.     Left Ear: Hearing, tympanic membrane, ear canal and external ear normal.     Nose: Nose normal.     Mouth/Throat:     Lips: Pink.     Mouth: Mucous membranes are moist.     Pharynx: Oropharynx is clear. Uvula midline. No pharyngeal swelling, oropharyngeal exudate, posterior oropharyngeal erythema or uvula swelling.  Eyes:     Extraocular Movements: Extraocular movements intact.     Conjunctiva/sclera: Conjunctivae normal.     Pupils: Pupils are equal, round, and reactive to light.  Neck:      Comments: Has full range of motion of neck but pain with flexion and hyperextension. Left upper trapezius muscle  tightness and spasms present. No redness or rash. No distinct swelling. Pain and tenderness travel down almost entire left trapezius muscle group. No neuro deficits noted.  Cardiovascular:     Rate and Rhythm: Normal rate and regular rhythm.     Heart sounds: Normal heart sounds. No murmur heard. Pulmonary:     Effort: Pulmonary effort is normal. No respiratory distress.     Breath sounds: Normal breath sounds and air entry. No decreased air movement. No decreased breath  sounds, wheezing, rhonchi or rales.  Abdominal:     General: Bowel sounds are increased. There is no distension.     Palpations: Abdomen is soft. There is no hepatomegaly or splenomegaly.     Tenderness: There is generalized abdominal tenderness. There is no right CVA tenderness, left CVA tenderness, guarding or rebound.  Musculoskeletal:     Cervical back: Normal range of motion and neck supple. Tenderness present. No erythema or rigidity. Pain with movement and muscular tenderness present. Normal range of motion.  Lymphadenopathy:     Cervical: No cervical adenopathy.  Skin:    General: Skin is warm and dry.     Capillary Refill: Capillary refill takes less than 2 seconds.     Findings: No rash.  Neurological:     General: No focal deficit present.     Mental Status: She is alert and oriented to person, place, and time.     Cranial Nerves: Cranial nerves 2-12 are intact.     Sensory: Sensation is intact. No sensory deficit.     Motor: Motor function is intact.  Psychiatric:        Mood and Affect: Mood normal.        Behavior: Behavior normal. Behavior is cooperative.        Thought Content: Thought content normal.        Judgment: Judgment normal.      UC Treatments / Results  Labs (all labs ordered are listed, but only abnormal results are displayed) Labs Reviewed - No data to display  EKG   Radiology No results found.  Procedures Procedures (including critical care time)  Medications Ordered in  UC Medications - No data to display  Initial Impression / Assessment and Plan / UC Course  I have reviewed the triage vital signs and the nursing notes.  Pertinent labs & imaging results that were available during my care of the patient were reviewed by me and considered in my medical decision making (see chart for details).     Reviewed with patient that she probably has a viral GI illness. Discussed that usually symptoms resolve within 48 hours and are improving today. May take Zofran 4mg  every 8 hours as needed for nausea. May drink clear fluids today and bland foods such as toast and rice and advance diet as tolerated.  Discussed that she has muscle spasms and tightness in her trapezius muscle group. Will trial Flexeril muscle relaxer- take 10mg  at night and 1/2 to 1 whole tablet every 8 hours during the day. Recommend massage neck and may apply warm compresses to neck area for comfort. May need to see PCP for physical therapy.  Reviewed fatigue and dizziness may be due to anemia or vitamin D deficiency or another etiology. Need further evaluation and lab work with PCP. Recommend call her PCP tomorrow to schedule appointment. Follow-up with her PCP as planned.  Final Clinical Impressions(s) / UC Diagnoses   Final diagnoses:  Nausea vomiting and diarrhea  Trapezius muscle spasm  Other fatigue  Neck pain on left side     Discharge Instructions       Se recomienda iniciar Zofran 4 mg cada 8 horas segn sea necesario para las nuseas. Comience con lquidos claros hoy y [employer] comenzar a comer alimentos blandos, como tostadas/pan y Occupational psychologist. Evite las comidas picantes durante los prximos das. Puede comenzar con el relajante muscular Flexeril: tome una tableta de 10 mg por la noche y luego puede tomar de 1/2 a 1  tableta entera cada 8 horas durante el da, segn sea necesario. Puede aplicar compresas tibias en el rea del cuello para mayor comodidad. Le recomendamos que llame a su PCP  maana y programe una cita para realizar una evaluacin adicional de la fatiga y volver a Chief Operating Officer la anemia.  Recommend start Zofran 4mg  every 8 hours as needed for nausea. Start with clear fluids today and may then start eating bland food, such as toast/bread, rice. Avoid spicy foods for the next few days.  May start Flexeril muscle relaxer- take 10mg  tablet at night and then may take 1/2 to 1 whole tablet every 8 hours during the day as needed. May apply warm compresses to neck area for comfort. Recommend call your PCP tomorrow and schedule appointment for further evaluation of fatigue and recheck of anemia.     ED Prescriptions     Medication Sig Dispense Auth. Provider   ondansetron (ZOFRAN) 4 MG tablet Take 1 tablet (4 mg total) by mouth every 8 (eight) hours as needed for nausea or vomiting. 15 tablet , NP   cyclobenzaprine (FLEXERIL) 10 MG tablet Take 1/2 to 1 whole tablet every 8 hours as needed for muscle spasms/pain 21 tablet Somtochukwu Woollard, , NP      PDMP not reviewed this encounter.   Sudie Grumbling, NP 05/30/22 2228

## 2022-05-29 NOTE — ED Triage Notes (Signed)
Pt presents with nausea, vomiting, diarrhea, and HA since yesterday. She denies abdominal pain but her stomach is growling.

## 2022-05-29 NOTE — Discharge Instructions (Addendum)
  Se recomienda iniciar Zofran 4 mg cada 8 horas segn sea necesario para las nuseas. Comience con lquidos claros hoy y [employer] comenzar a comer alimentos blandos, como tostadas/pan y Occupational psychologist. Evite las comidas picantes durante los prximos das. Puede comenzar con el relajante muscular Flexeril: tome una tableta de 10 mg por la noche y [employer] puede tomar de 1/2 a 1 tableta entera cada 8 horas durante el da, segn sea necesario. Puede aplicar compresas tibias en el rea del cuello para mayor comodidad. Le recomendamos que llame a su PCP maana y programe una cita para realizar una evaluacin adicional de la fatiga y volver a Chief Technology Officer la anemia.  Recommend start Zofran 4mg  every 8 hours as needed for nausea. Start with clear fluids today and may then start eating bland food, such as toast/bread, rice. Avoid spicy foods for the next few days.  May start Flexeril muscle relaxer- take 10mg  tablet at night and then may take 1/2 to 1 whole tablet every 8 hours during the day as needed. May apply warm compresses to neck area for comfort. Recommend call your PCP tomorrow and schedule appointment for further evaluation of fatigue and recheck of anemia.

## 2022-06-25 ENCOUNTER — Ambulatory Visit (INDEPENDENT_AMBULATORY_CARE_PROVIDER_SITE_OTHER): Payer: Medicaid Other

## 2022-06-25 ENCOUNTER — Ambulatory Visit: Payer: Medicaid Other | Attending: Cardiology | Admitting: Cardiology

## 2022-06-25 ENCOUNTER — Encounter: Payer: Self-pay | Admitting: Cardiology

## 2022-06-25 VITALS — BP 100/70 | HR 107 | Ht 63.0 in | Wt 189.8 lb

## 2022-06-25 DIAGNOSIS — Z6833 Body mass index (BMI) 33.0-33.9, adult: Secondary | ICD-10-CM | POA: Diagnosis not present

## 2022-06-25 DIAGNOSIS — R002 Palpitations: Secondary | ICD-10-CM

## 2022-06-25 DIAGNOSIS — R011 Cardiac murmur, unspecified: Secondary | ICD-10-CM | POA: Diagnosis not present

## 2022-06-25 NOTE — Patient Instructions (Signed)
Medication Instructions:   Your physician recommends that you continue on your current medications as directed. Please refer to the Current Medication list given to you today.   *If you need a refill on your cardiac medications before your next appointment, please call your pharmacy*   Testing/Procedures:  Your physician has requested that you have an echocardiogram. Echocardiography is a painless test that uses sound waves to create images of your heart. It provides your doctor with information about the size and shape of your heart and how well your heart's chambers and valves are working. This procedure takes approximately one hour. There are no restrictions for this procedure. Please do NOT wear cologne, perfume, aftershave, or lotions (deodorant is allowed). Please arrive 15 minutes prior to your appointment time.  2.  Your physician has recommended that you wear a Zio XT monitor for 2 weeks. This will be mailed to your home address in 4-5 business days.   Your clinician has requested a Zio heart rhythm monitor by [employer] to be mailed to your home for you to wear for 14 days. You should expect a small box to arrive via USPS (or FedEx in some cases) within this next week. If you do not receive it please call [employer] at [phone].  Closely watching your heart at this time will help your care team understand more and provide information needed to develop your plan of care.  Please apply your Zio patch monitor the day you receive it. Keep this packaging, you will use this to return your Zio monitor.  You will easily be able to apply the monitor with the instructions provided in the Patient Guide.  If you need assistance, [employer] representatives are available 24/7 at [phone].  You can also download the MyZio app on your phone to view detailed application instructions and log symptoms.  After you wear your monitor for 14 days, place it back in the blue box or envelope, along  with your Symptom Log.  To send your monitor back: Simply use the pre-addressed and pre-paid box/envelope.  Send it back through USPS mail the same day you remove it via your local post office or by placing it in your mailbox.  As soon as we receive the results, they will be reviewed and your clinician will contact you.  For the first 24 hours- it is essential to not shower or exercise, to allow the patch to adhere to your skin. Avoid excessive sweating to help maximize wear time. Do not submerge the device, no hot tubs, and no swimming pools. Keep any lotions or oils away from the patch. After 24 hours you may shower with the patch on. Take brief showers with your back facing the shower head.  Do not remove patch once it has been placed because that will interrupt data and decrease adhesive wear time. Push the button when you have any symptoms and write down what you were feeling. Once you have completed wearing your monitor, remove and place into box which has postage paid and place in your outgoing mailbox.  If for some reason you have misplaced your box then call our office and we can provide another box and/or mail it off for you.   Follow-Up: At [facility], you and your health needs are our priority.  As part of our continuing mission to provide you with exceptional heart care, we have created designated Provider Care Teams.  These Care Teams include your primary Cardiologist (physician) and Advanced Practice Providers (APPs -    Physician Assistants and Nurse Practitioners) who all work together to provide you with the care you need, when you need it.  We recommend signing up for the patient portal called "MyChart".  Sign up information is provided on this After Visit Summary.  MyChart is used to connect with patients for Virtual Visits (Telemedicine).  Patients are able to view lab/test results, encounter notes, upcoming appointments, etc.  Non-urgent messages can be sent to  your provider as well.   To learn more about what you can do with MyChart, go to [email or web address].    Your next appointment:   6-8 week(s)  The format for your next appointment:   In Person  Provider:   You may see Brian Agbor-Etang, MD or one of the following Advanced Practice Providers on your designated Care Team:   Christopher Berge, NP Ryan Dunn, PA-C Cadence Furth, PA-C Sheri Hammock, NP    Other Instructions   Important Information About Sugar       

## 2022-06-25 NOTE — Progress Notes (Signed)
Cardiology Office Note:    Date:  06/25/2022   ID:  Theresa Stevens, DOB [birth year], MRN [id number]  PCP:  [facility] Providers Cardiologist:  None     Referring MD: [facility]   Chief Complaint  Patient presents with   New Patient (Initial Visit)    Palpitations, No Hx, no know family Hx    History of Present Illness:    Theresa Stevens is a 36 y.o. female with no significant past medical history who presents due to palpitations.  Symptoms of palpitations initially began after exercising/running.  Over the past 2 months, she has palpitations while sitting.  This of course 2 times every week lasting 3 to 4 minutes.  Denies dizziness, syncope.  States gaining about 25 pounds over the past year.  Was given atenolol 3 months ago due to palpitations, she took for 3 weeks, this improves symptoms.  Not currently taking any medications.  Denies any history of heart disease.  Past Medical History:  Diagnosis Date   Hypertension     Past Surgical History:  Procedure Laterality Date   NO PAST SURGERIES      Current Medications: Current Meds  Medication Sig   ibuprofen (ADVIL) 200 MG tablet Take 200 mg by mouth every 6 (six) hours as needed for mild pain.     Allergies:   Patient has no known allergies.   Social History   Socioeconomic History   Marital status: Married    Spouse name: Not on file   Number of children: Not on file   Years of education: Not on file   Highest education level: Not on file  Occupational History   Not on file  Tobacco Use   Smoking status: Never    Passive exposure: Past   Smokeless tobacco: Never  Vaping Use   Vaping Use: Never used  Substance and Sexual Activity   Alcohol use: No   Drug use: No   Sexual activity: Not on file  Other Topics Concern   Not on file  Social History Narrative   Not on file   Social Determinants of Health   Financial Resource Strain: Not on  file  Food Insecurity: Not on file  Transportation Needs: Not on file  Physical Activity: Not on file  Stress: Not on file  Social Connections: Not on file     Family History: The patient's family history is not on file. She was adopted.  ROS:   Please see the history of present illness.     All other systems reviewed and are negative.  EKGs/Labs/Other Studies Reviewed:    The following studies were reviewed today:   EKG:  EKG is  ordered today.  The ekg ordered today demonstrates sinus tachycardia, heart rate 107  Recent Labs: No results found for requested labs within last 365 days.  Recent Lipid Panel No results found for: "CHOL", "TRIG", "HDL", "CHOLHDL", "VLDL", "LDLCALC", "LDLDIRECT"   Risk Assessment/Calculations:             Physical Exam:    VS:  BP 100/70 (BP Location: Right Arm, Patient Position: Sitting, Cuff Size: Normal)   Pulse (!) 107   Ht 5\' 3"  (1.6 m)   Wt 189 lb 12.8 oz (86.1 kg)   LMP 05/22/2022   SpO2 100%   BMI 33.62 kg/m     Wt Readings from Last 3 Encounters:  06/25/22 189 lb 12.8 oz (86.1 kg)  05/20/22 189 lb (85.7 kg)  08/08/17 185 lb (83.9 kg)     GEN:  Well nourished, well developed in no acute distress HEENT: Normal NECK: No JVD; No carotid bruits CARDIAC: RRR, 1/6 systolic murmur RESPIRATORY:  Clear to auscultation without rales, wheezing or rhonchi  ABDOMEN: Soft, non-tender, non-distended MUSCULOSKELETAL:  No edema; No deformity  SKIN: Warm and dry NEUROLOGIC:  Alert and oriented x 3 PSYCHIATRIC:  Normal affect   ASSESSMENT:    1. Palpitations   2. Systolic murmur   3. BMI 33.0-33.9,adult    PLAN:    In order of problems listed above:  Palpitations, place cardiac monitor to evaluate any significant arrhythmias.  Deconditioning, likely contributing to increased heart rates with exercise.  If symptoms persist, may consider low-dose beta-blocker.  Blood pressure low normal, avoid medications for now. Systolic murmur,  get echo to evaluate any valvular abnormalities. Obesity, low-calorie diet, weight loss, increased activity advised.  Follow-up after echo and Cardiac monitor     Medication Adjustments/Labs and Tests Ordered: Current medicines are reviewed at length with the patient today.  Concerns regarding medicines are outlined above.  Orders Placed This Encounter  Procedures   LONG TERM MONITOR (3-14 DAYS)   EKG 12-Lead   ECHOCARDIOGRAM COMPLETE   No orders of the defined types were placed in this encounter.   Patient Instructions  Medication Instructions:   Your physician recommends that you continue on your current medications as directed. Please refer to the Current Medication list given to you today.   *If you need a refill on your cardiac medications before your next appointment, please call your pharmacy*   Testing/Procedures:   Your physician has requested that you have an echocardiogram. Echocardiography is a painless test that uses sound waves to create images of your heart. It provides your doctor with information about the size and shape of your heart and how well your heart's chambers and valves are working. This procedure takes approximately one hour. There are no restrictions for this procedure. Please do NOT wear cologne, perfume, aftershave, or lotions (deodorant is allowed). Please arrive 15 minutes prior to your appointment time.  2.  Your physician has recommended that you wear a Zio XT monitor for 2 weeks. This will be mailed to your home address in 4-5 business days.   Your clinician has requested a Zio heart rhythm monitor by [employer] to be mailed to your home for you to wear for 14 days. You should expect a small box to arrive via USPS (or FedEx in some cases) within this next week. If you do not receive it please call [employer] at [phone].  Closely watching your heart at this time will help your care team understand more and provide information needed to develop  your plan of care.  Please apply your Zio patch monitor the day you receive it. Keep this packaging, you will use this to return your Zio monitor.  You will easily be able to apply the monitor with the instructions provided in the Patient Guide.  If you need assistance, [employer] representatives are available 24/7 at [phone].  You can also download the [facility] app on your phone to view detailed application instructions and log symptoms.  After you wear your monitor for 14 days, place it back in the blue box or envelope, along with your Symptom Log.  To send your monitor back: Simply use the pre-addressed and pre-paid box/envelope.  Send it back through [employer] the same day you remove it  via your local post office or by placing it in your mailbox.  As soon as we receive the results, they will be reviewed and your clinician will contact you.  For the first 24 hours- it is essential to not shower or exercise, to allow the patch to adhere to your skin. Avoid excessive sweating to help maximize wear time. Do not submerge the device, no hot tubs, and no swimming pools. Keep any lotions or oils away from the patch. After 24 hours you may shower with the patch on. Take brief showers with your back facing the shower head.  Do not remove patch once it has been placed because that will interrupt data and decrease adhesive wear time. Push the button when you have any symptoms and write down what you were feeling. Once you have completed wearing your monitor, remove and place into box which has postage paid and place in your outgoing mailbox.  If for some reason you have misplaced your box then call our office and we can provide another box and/or mail it off for you.    Follow-Up: At [facility], you and your health needs are our priority.  As part of our continuing mission to provide you with exceptional heart care, we have created designated Provider Care Teams.  These Care Teams  include your primary Cardiologist (physician) and Advanced Practice Providers (APPs -  Physician Assistants and Nurse Practitioners) who all work together to provide you with the care you need, when you need it.  We recommend signing up for the patient portal called "MyChart".  Sign up information is provided on this After Visit Summary.  MyChart is used to connect with patients for Virtual Visits (Telemedicine).  Patients are able to view lab/test results, encounter notes, upcoming appointments, etc.  Non-urgent messages can be sent to your provider as well.   To learn more about what you can do with MyChart, go to [email or web address].    Your next appointment:   6-8 week(s)  The format for your next appointment:   In Person  Provider:   You may see Debbe Odea, MD or one of the following Advanced Practice Providers on your designated Care Team:   Nicolasa Ducking, NP Eula Listen, PA-C Cadence Fransico Michael, PA-C Charlsie Quest, NP    Other Instructions   Important Information About Sugar         Signed, Debbe Odea, MD  06/25/2022 10:08 AM    [facility]

## 2022-06-27 DIAGNOSIS — R002 Palpitations: Secondary | ICD-10-CM

## 2022-07-15 ENCOUNTER — Telehealth: Payer: Self-pay | Admitting: Cardiology

## 2022-07-15 NOTE — Telephone Encounter (Signed)
Calling about her heart monitor. Please advise

## 2022-07-16 NOTE — Telephone Encounter (Signed)
Called patient using interpreter [phone]. Received the message "welcome to the mailbox system, please enter your pin".   Will try and call patient back again later.

## 2022-07-30 ENCOUNTER — Ambulatory Visit: Payer: Medicaid Other | Attending: Cardiology

## 2022-07-30 DIAGNOSIS — R011 Cardiac murmur, unspecified: Secondary | ICD-10-CM

## 2022-07-30 LAB — ECHOCARDIOGRAM COMPLETE
AR max vel: 2.07 cm2
AV Area VTI: 2.32 cm2
AV Area mean vel: 2.31 cm2
AV Mean grad: 5 mmHg
AV Peak grad: 10.5 mmHg
Ao pk vel: 1.62 m/s
Area-P 1/2: 4.12 cm2
S' Lateral: 2.2 cm

## 2022-08-08 ENCOUNTER — Telehealth (INDEPENDENT_AMBULATORY_CARE_PROVIDER_SITE_OTHER): Payer: Self-pay | Admitting: Vascular Surgery

## 2022-08-08 NOTE — Telephone Encounter (Signed)
Via interpreter services, spoke with pt regarding the back order of supplies needed for laser. I assured pt that I would be calling as soon as possible for the scheduling.

## 2022-08-15 ENCOUNTER — Ambulatory Visit: Payer: PRIVATE HEALTH INSURANCE | Admitting: Cardiology

## 2022-09-14 ENCOUNTER — Other Ambulatory Visit: Payer: Self-pay

## 2022-09-14 ENCOUNTER — Emergency Department: Payer: Medicaid Other

## 2022-09-14 ENCOUNTER — Emergency Department
Admission: EM | Admit: 2022-09-14 | Discharge: 2022-09-14 | Disposition: A | Discharge status: Home / Self Care | Payer: Medicaid Other | Attending: Emergency Medicine | Admitting: Emergency Medicine

## 2022-09-14 DIAGNOSIS — I10 Essential (primary) hypertension: Secondary | ICD-10-CM | POA: Diagnosis not present

## 2022-09-14 DIAGNOSIS — N10 Acute tubulo-interstitial nephritis: Secondary | ICD-10-CM | POA: Insufficient documentation

## 2022-09-14 DIAGNOSIS — R300 Dysuria: Secondary | ICD-10-CM | POA: Diagnosis present

## 2022-09-14 DIAGNOSIS — N12 Tubulo-interstitial nephritis, not specified as acute or chronic: Secondary | ICD-10-CM

## 2022-09-14 LAB — URINALYSIS, ROUTINE W REFLEX MICROSCOPIC
Bilirubin Urine: NEGATIVE
Glucose, UA: NEGATIVE mg/dL
Ketones, ur: NEGATIVE mg/dL
Nitrite: NEGATIVE
Protein, ur: NEGATIVE mg/dL
Specific Gravity, Urine: 1.008 (ref 1.005–1.030)
pH: 6 (ref 5.0–8.0)

## 2022-09-14 LAB — CBC
HCT: 45.5 % (ref 36.0–46.0)
Hemoglobin: 14.6 g/dL (ref 12.0–15.0)
MCH: 27.3 pg (ref 26.0–34.0)
MCHC: 32.1 g/dL (ref 30.0–36.0)
MCV: 85.2 fL (ref 80.0–100.0)
Platelets: 399 10*3/uL (ref 150–400)
RBC: 5.34 MIL/uL — ABNORMAL HIGH (ref 3.87–5.11)
RDW: 12.8 % (ref 11.5–15.5)
WBC: 11.4 10*3/uL — ABNORMAL HIGH (ref 4.0–10.5)
nRBC: 0 % (ref 0.0–0.2)

## 2022-09-14 LAB — LIPASE, BLOOD: Lipase: 39 U/L (ref 11–51)

## 2022-09-14 LAB — BASIC METABOLIC PANEL
Anion gap: 6 (ref 5–15)
BUN: 12 mg/dL (ref 6–20)
CO2: 25 mmol/L (ref 22–32)
Calcium: 9.3 mg/dL (ref 8.9–10.3)
Chloride: 105 mmol/L (ref 98–111)
Creatinine, Ser: 0.79 mg/dL (ref 0.44–1.00)
GFR, Estimated: >60 mL/min (ref >60)
Glucose, Bld: 122 mg/dL — ABNORMAL HIGH (ref 70–99)
Potassium: 4.1 mmol/L (ref 3.5–5.1)
Sodium: 136 mmol/L (ref 135–145)

## 2022-09-14 LAB — HEPATIC FUNCTION PANEL
ALT: 17 U/L (ref 0–44)
AST: 16 U/L (ref 15–41)
Albumin: 3.9 g/dL (ref 3.5–5.0)
Alkaline Phosphatase: 84 U/L (ref 38–126)
Bilirubin, Direct: <0.1 mg/dL (ref 0.0–0.2)
Total Bilirubin: 0.5 mg/dL (ref 0.3–1.2)
Total Protein: 7.6 g/dL (ref 6.5–8.1)

## 2022-09-14 LAB — POC URINE PREG, ED: Preg Test, Ur: NEGATIVE

## 2022-09-14 MED ORDER — CEFTRIAXONE SODIUM 1 G IJ SOLR
1.0000 g | Freq: Once | INTRAMUSCULAR | Status: AC
  Administered 2022-09-14: 1 g via INTRAVENOUS
  Filled 2022-09-14: qty 10

## 2022-09-14 MED ORDER — ONDANSETRON 4 MG PO TBDP: 4.0000 mg | ORAL_TABLET | Freq: Three times a day (TID) | ORAL | 0 refills | Status: AC | PRN

## 2022-09-14 MED ORDER — MORPHINE SULFATE (PF) 4 MG/ML IV SOLN
4.0000 mg | Freq: Once | INTRAVENOUS | Status: AC
  Administered 2022-09-14: 4 mg via INTRAVENOUS
  Filled 2022-09-14: qty 1

## 2022-09-14 MED ORDER — LACTATED RINGERS IV BOLUS
1000.0000 mL | Freq: Once | INTRAVENOUS | Status: AC
  Administered 2022-09-14: 1000 mL via INTRAVENOUS

## 2022-09-14 MED ORDER — CEFDINIR 300 MG PO CAPS: 300.0000 mg | ORAL_CAPSULE | Freq: Two times a day (BID) | ORAL | 0 refills | Status: AC

## 2022-09-14 MED ORDER — ONDANSETRON HCL 4 MG/2ML IJ SOLN
4.0000 mg | Freq: Once | INTRAMUSCULAR | Status: AC
  Administered 2022-09-14: 4 mg via INTRAVENOUS
  Filled 2022-09-14: qty 2

## 2022-09-14 NOTE — ED Provider Notes (Signed)
[facility] Provider Note    Event Date/Time   First MD Initiated Contact with Patient 09/14/22 1113     (approximate)   History   Chief Complaint Dysuria   HPI  Theresa Stevens is a 37 y.o. female with past medical history of hypertension who presents to the ED complaining of abdominal pain.  History is limited as patient is Spanish-speaking only, history obtained with assistance of in person interpreter.  Patient reports that she has been dealing with increasing pain starting in her right flank and wrapping around towards the right lower quadrant of her abdomen for the past 3 days.  She describes the pain as a burning and has noticed some blood when she goes to wipe after urinating.  She has not noticed vaginal bleeding at other times and denies any discharge.  She denies any fevers or dysuria, does endorse nausea but has not had any vomiting or diarrhea.  She has not noticed any blood in her stool.  She denies any history of similar symptoms, still has her appendix and denies history of kidney stones.     Physical Exam   Triage Vital Signs: ED Triage Vitals  Enc Vitals Group     BP 09/14/22 1100 (!) 116/90     Pulse Rate 09/14/22 1100 (!) 122     Resp 09/14/22 1100 18     Temp 09/14/22 1104 98.4 F (36.9 C)     Temp Source 09/14/22 1104 Oral     SpO2 09/14/22 1100 98 %     Weight 09/14/22 1102 190 lb (86.2 kg)     Height 09/14/22 1102 5\' 3"  (1.6 m)     Head Circumference --      Peak Flow --      Pain Score 09/14/22 1101 7     Pain Loc --      Pain Edu? --      Excl. in [place]? --     Most recent vital signs: Vitals:   09/14/22 1100 09/14/22 1104  BP: (!) 116/90   Pulse: (!) 122   Resp: 18   Temp:  98.4 F (36.9 C)  SpO2: 98%     Constitutional: Alert and oriented. Eyes: Conjunctivae are normal. Head: Atraumatic. Nose: No congestion/rhinnorhea. Mouth/Throat: Mucous membranes are moist.  Cardiovascular: Normal rate, regular rhythm.  Grossly normal heart sounds.  2+ radial pulses bilaterally. Respiratory: Normal respiratory effort.  No retractions. Lungs CTAB. Gastrointestinal: Soft and tender to palpation in the right lower quadrant with no rebound or guarding.  Right CVA tenderness to palpation noted. No distention. Musculoskeletal: No lower extremity tenderness nor edema.  Neurologic:  Normal speech and language. No gross focal neurologic deficits are appreciated.    ED Results / Procedures / Treatments   Labs (all labs ordered are listed, but only abnormal results are displayed) Labs Reviewed  URINALYSIS, ROUTINE W REFLEX MICROSCOPIC - Abnormal; Notable for the following components:      Result Value   Color, Urine YELLOW (*)    APPearance CLOUDY (*)    Hgb urine dipstick LARGE (*)    Leukocytes,Ua LARGE (*)    Bacteria, UA FEW (*)    All other components within normal limits  BASIC METABOLIC PANEL - Abnormal; Notable for the following components:   Glucose, Bld 122 (*)    All other components within normal limits  CBC - Abnormal; Notable for the following components:   WBC 11.4 (*)    RBC 5.34 (*)  All other components within normal limits  URINE CULTURE  HEPATIC FUNCTION PANEL  LIPASE, BLOOD  POC URINE PREG, ED   RADIOLOGY CT renal protocol reviewed and interpreted by me with no obstructing stones or inflammatory changes noted.  PROCEDURES:  Critical Care performed: No  Procedures   MEDICATIONS ORDERED IN ED: Medications  lactated ringers bolus 1,000 mL (0 mLs Intravenous Paused 09/14/22 1339)  morphine (PF) 4 MG/ML injection 4 mg (4 mg Intravenous Given 09/14/22 1221)  ondansetron (ZOFRAN) injection 4 mg (4 mg Intravenous Given 09/14/22 1221)  cefTRIAXone (ROCEPHIN) 1 g in sodium chloride 0.9 % 100 mL IVPB (1 g Intravenous New Bag/Given 09/14/22 1340)     IMPRESSION / MDM / ASSESSMENT AND PLAN / ED COURSE  I reviewed the triage vital signs and the nursing notes.                               37 y.o. female with past medical history of hypertension who presents to the ED complaining of 3 days of right flank pain radiating towards the right lower quadrant of her abdomen described as a burning.  Patient's presentation is most consistent with acute presentation with potential threat to life or bodily function.  Differential diagnosis includes, but is not limited to, pyelonephritis, kidney stone, cystitis, appendicitis, dehydration, electrolyte abnormality, AKI, pregnancy.  Patient uncomfortable appearing but nontoxic and in no acute distress, vital signs remarkable for tachycardia but otherwise reassuring.  She has tenderness to palpation in the right lower quadrant as well as in her right costovertebral area.  Urinalysis appears contaminated but does have signs of infection and patient's symptoms are concerning for UTI.  We will further assess with CT imaging to rule out associated kidney stone, send urine for culture.  Pregnancy testing is negative and labs thus far show no significant anemia, leukocytosis, electrolyte abnormality, or AKI.  Plan to treat symptomatically with IV morphine and Zofran, hydrate with IV fluids.  CT imaging shows nonobstructing stone in the left kidney but no right renal stones or other findings to explain patient's pain.  Suspect symptoms are due to pyelonephritis and she reports feeling better following pain and nausea medicine on reassessment.  Patient was given dose of IV Rocephin here in the ED, is appropriate for outpatient management with cefdinir.  She was counseled to follow-up with her PCP and to return to the ED for new or worsening symptoms, patient agrees with plan.      FINAL CLINICAL IMPRESSION(S) / ED DIAGNOSES   Final diagnoses:  Pyelonephritis     Rx / DC Orders   ED Discharge Orders          Ordered    ondansetron (ZOFRAN-ODT) 4 MG disintegrating tablet  Every 8 hours PRN        09/14/22 1408    cefdinir (OMNICEF) 300 MG  capsule  2 times daily        09/14/22 1408             Note:  This document was prepared using Dragon voice recognition software and may include unintentional dictation errors.   Blake Divine, MD 09/14/22 [phone]

## 2022-09-14 NOTE — ED Notes (Signed)
Lab called to add on urine culture.  ?

## 2022-09-14 NOTE — ED Triage Notes (Signed)
Pt has had dysuria and lower back pain for the last few days- pt denies fever, but does have chills- pt denies blood in the urine, but does see blood when she wipes

## 2022-09-15 LAB — URINE CULTURE: Culture: NO GROWTH

## 2022-09-20 ENCOUNTER — Emergency Department: Payer: Medicaid Other

## 2022-09-20 ENCOUNTER — Emergency Department
Admission: EM | Admit: 2022-09-20 | Discharge: 2022-09-20 | Disposition: A | Discharge status: Home / Self Care | Payer: Medicaid Other | Attending: Emergency Medicine | Admitting: Emergency Medicine

## 2022-09-20 ENCOUNTER — Encounter: Payer: Self-pay | Admitting: Emergency Medicine

## 2022-09-20 ENCOUNTER — Other Ambulatory Visit: Payer: Self-pay

## 2022-09-20 DIAGNOSIS — N12 Tubulo-interstitial nephritis, not specified as acute or chronic: Secondary | ICD-10-CM | POA: Insufficient documentation

## 2022-09-20 DIAGNOSIS — D72829 Elevated white blood cell count, unspecified: Secondary | ICD-10-CM | POA: Diagnosis not present

## 2022-09-20 DIAGNOSIS — N281 Cyst of kidney, acquired: Secondary | ICD-10-CM

## 2022-09-20 DIAGNOSIS — R109 Unspecified abdominal pain: Secondary | ICD-10-CM | POA: Diagnosis present

## 2022-09-20 DIAGNOSIS — I10 Essential (primary) hypertension: Secondary | ICD-10-CM | POA: Diagnosis not present

## 2022-09-20 LAB — URINALYSIS, ROUTINE W REFLEX MICROSCOPIC
Bacteria, UA: NONE SEEN
Bilirubin Urine: NEGATIVE
Glucose, UA: NEGATIVE mg/dL
Ketones, ur: NEGATIVE mg/dL
Nitrite: NEGATIVE
Protein, ur: NEGATIVE mg/dL
Specific Gravity, Urine: 1.02 (ref 1.005–1.030)
pH: 6 (ref 5.0–8.0)

## 2022-09-20 LAB — PREGNANCY, URINE: Preg Test, Ur: NEGATIVE

## 2022-09-20 LAB — CBC
HCT: 38.5 % (ref 36.0–46.0)
Hemoglobin: 12.6 g/dL (ref 12.0–15.0)
MCH: 27.3 pg (ref 26.0–34.0)
MCHC: 32.7 g/dL (ref 30.0–36.0)
MCV: 83.5 fL (ref 80.0–100.0)
Platelets: 358 10*3/uL (ref 150–400)
RBC: 4.61 MIL/uL (ref 3.87–5.11)
RDW: 13.1 % (ref 11.5–15.5)
WBC: 12.3 10*3/uL — ABNORMAL HIGH (ref 4.0–10.5)
nRBC: 0 % (ref 0.0–0.2)

## 2022-09-20 LAB — LIPASE, BLOOD: Lipase: 26 U/L (ref 11–51)

## 2022-09-20 LAB — COMPREHENSIVE METABOLIC PANEL
ALT: 15 U/L (ref 0–44)
AST: 15 U/L (ref 15–41)
Albumin: 3.8 g/dL (ref 3.5–5.0)
Alkaline Phosphatase: 72 U/L (ref 38–126)
Anion gap: 7 (ref 5–15)
BUN: 17 mg/dL (ref 6–20)
CO2: 25 mmol/L (ref 22–32)
Calcium: 9 mg/dL (ref 8.9–10.3)
Chloride: 103 mmol/L (ref 98–111)
Creatinine, Ser: 0.79 mg/dL (ref 0.44–1.00)
GFR, Estimated: >60 mL/min (ref >60)
Glucose, Bld: 133 mg/dL — ABNORMAL HIGH (ref 70–99)
Potassium: 3.4 mmol/L — ABNORMAL LOW (ref 3.5–5.1)
Sodium: 135 mmol/L (ref 135–145)
Total Bilirubin: 0.4 mg/dL (ref 0.3–1.2)
Total Protein: 6.9 g/dL (ref 6.5–8.1)

## 2022-09-20 MED ORDER — MORPHINE SULFATE (PF) 4 MG/ML IV SOLN
4.0000 mg | Freq: Once | INTRAVENOUS | Status: AC
  Administered 2022-09-20: 4 mg via INTRAVENOUS
  Filled 2022-09-20: qty 1

## 2022-09-20 MED ORDER — ONDANSETRON HCL 4 MG/2ML IJ SOLN
4.0000 mg | Freq: Once | INTRAMUSCULAR | Status: AC
  Administered 2022-09-20: 4 mg via INTRAVENOUS
  Filled 2022-09-20: qty 2

## 2022-09-20 MED ORDER — MORPHINE SULFATE (PF) 4 MG/ML IV SOLN
4.0000 mg | Freq: Once | INTRAVENOUS | Status: DC
  Filled 2022-09-20: qty 1

## 2022-09-20 MED ORDER — SODIUM CHLORIDE 0.9 % IV BOLUS
1000.0000 mL | Freq: Once | INTRAVENOUS | Status: AC
  Administered 2022-09-20: 1000 mL via INTRAVENOUS

## 2022-09-20 MED ORDER — OXYCODONE HCL 5 MG PO TABS: 5.0000 mg | ORAL_TABLET | Freq: Once | ORAL | Status: DC

## 2022-09-20 NOTE — Discharge Instructions (Addendum)
Please call and schedule follow up appointment with your primary care provider.

## 2022-09-20 NOTE — ED Triage Notes (Signed)
Patient to ED via POV for lower abd/ back pain with nausea. Patient seen for same on 1/13- told she had a urine infection. Currently taking medications with no relief.

## 2022-09-20 NOTE — ED Provider Notes (Signed)
[facility] Provider Note    Event Date/Time   First MD Initiated Contact with Patient 09/20/22 1932     (approximate)   History   Abdominal Pain   HPI  MAO LOCKNER is a 37 y.o. female with history of hypertension and as listed in EMR presents to the emergency department for evaluation of left flank pain.  She was evaluated here a week ago and advised that she had a kidney infection.  She was treated with antibiotics and pain medications.  She did follow-up with her primary care provider who changed her antibiotic and also added Flomax.  Patient states that her pain has gotten worse and the medications are not helping.  She denies fever, nausea, or vomiting.      Physical Exam   Triage Vital Signs: ED Triage Vitals [09/20/22 1709]  Enc Vitals Group     BP 120/81     Pulse Rate (!) 111     Resp 18     Temp 98.4 F (36.9 C)     Temp Source Oral     SpO2 98 %     Weight      Height      Head Circumference      Peak Flow      Pain Score 7     Pain Loc      Pain Edu?      Excl. in [place]?     Most recent vital signs: Vitals:   09/20/22 1709 09/20/22 2325  BP: 120/81 114/64  Pulse: (!) 111 97  Resp: 18 17  Temp: 98.4 F (36.9 C) 97.9 F (36.6 C)  SpO2: 98% 98%     General: Awake, no distress.  CV:  Good peripheral perfusion.  Resp:  Normal effort.  Abd:  No distention.  Other:  Left CVA tenderness.  Left lower quadrant tenderness.  Suprapubic tenderness.   ED Results / Procedures / Treatments   Labs (all labs ordered are listed, but only abnormal results are displayed) Labs Reviewed  COMPREHENSIVE METABOLIC PANEL - Abnormal; Notable for the following components:      Result Value   Potassium 3.4 (*)    Glucose, Bld 133 (*)    All other components within normal limits  CBC - Abnormal; Notable for the following components:   WBC 12.3 (*)    All other components within normal limits  URINALYSIS, ROUTINE W REFLEX MICROSCOPIC -  Abnormal; Notable for the following components:   Color, Urine YELLOW (*)    APPearance HAZY (*)    Hgb urine dipstick LARGE (*)    Leukocytes,Ua MODERATE (*)    All other components within normal limits  LIPASE, BLOOD  PREGNANCY, URINE  POC URINE PREG, ED     EKG  Not indicated   RADIOLOGY  CT for renal stone study shows nonobstructing bilateral renal calculi.  No ureteral calculi or hydronephrosis.   PROCEDURES:  Critical Care performed: No  Procedures   MEDICATIONS ORDERED IN ED: Medications  morphine (PF) 4 MG/ML injection 4 mg (4 mg Intravenous Not Given 09/20/22 2253)  oxyCODONE (Oxy IR/ROXICODONE) immediate release tablet 5 mg (5 mg Oral Not Given 09/20/22 2325)  sodium chloride 0.9 % bolus 1,000 mL (0 mLs Intravenous Stopped 09/20/22 2249)  morphine (PF) 4 MG/ML injection 4 mg (4 mg Intravenous Given 09/20/22 2017)  ondansetron (ZOFRAN) injection 4 mg (4 mg Intravenous Given 09/20/22 2017)     IMPRESSION / MDM / ASSESSMENT AND  PLAN / ED COURSE  I reviewed the triage vital signs and the nursing notes.                              Differential diagnosis includes, but is not limited to, obstructing stone, worsening pyelonephritis, ovarian torsion, ovarian cyst, mesenteric adenitis  Patient's presentation is most consistent with acute presentation with potential threat to life or bodily function.  37 year old female presenting to the emergency department for treatment and evaluation of left flank pain that radiates into the left lower quadrant.  She is also having difficulty urinating.  Interview and exam limited by language barrier.  In person interpreter utilized.  Upon review of chart from last visit 1 week ago, CT for renal stone study was obtained and did show a nonobstructing 7 mm calculus in the left kidney.  At that time according to the documentation she was having pain only on the right side and was diagnosed with pyelonephritis for which she was given  antibiotics.  These results were reviewed with the patient as well as the plan to perform a repeat scan to ensure that the stone is not now obstructing.  Labs obtained while awaiting ER room assignment do show a mild leukocytosis of 12.3 with no left shift.  CMP is unremarkable as is lipase at 26.  Urinalysis remains in process.  IV fluids, pain medication, nausea medication and CT ordered.  CT scan is without evidence of obstructing stone or hydronephrosis.  Stones are not likely the cause of patient's pain.  She has had fluids and IV pain medication and is more comfortable with this point.   Urinalysis shows moderate amount of leukocytes, 21-50 white blood cells however is somewhat contaminated with 11-20 squamous epithelial cells.  No bacteria is seen.  Urine culture from 1 week ago had no growth.   Results discussed with the patient who is frustrated that she is not feeling better after taking the medications prescribed to her at her last visit and at her primary care provider's visit.  Additional pain medication was offered and she refused.  She wants to go home.  No additional testing or imaging is indicated from an ER perspective.  She has no evidence of sepsis she has no obstructing stone or hydronephrosis and her other labs and vital signs are reassuring.  She was advised to continue taking her antibiotic that was prescribed by primary care as well as pain medication.  She was encouraged to stay well-hydrated.  She was advised to schedule follow-up appointment with her primary care provider.  She would also like to see a specialist and was therefore given information regarding urology.  She was encouraged to return to the emergency department if she develops a fever, the pain gets worse, or she has other symptoms of concern and is unable to schedule an appointment.      FINAL CLINICAL IMPRESSION(S) / ED DIAGNOSES   Final diagnoses:  Pyelonephritis  Renal cyst     Rx / DC Orders   ED  Discharge Orders     None        Note:  This document was prepared using Dragon voice recognition software and may include unintentional dictation errors.   Victorino Dike, FNP 09/20/22 2356    Carrie Mew, MD 09/25/22 [phone]

## 2022-10-30 ENCOUNTER — Ambulatory Visit (INDEPENDENT_AMBULATORY_CARE_PROVIDER_SITE_OTHER): Payer: Medicaid Other | Admitting: Urology

## 2022-10-30 VITALS — BP 126/91 | HR 112 | Ht 63.0 in | Wt 190.0 lb

## 2022-10-30 DIAGNOSIS — N200 Calculus of kidney: Secondary | ICD-10-CM | POA: Diagnosis not present

## 2022-10-30 DIAGNOSIS — M545 Low back pain, unspecified: Secondary | ICD-10-CM

## 2022-10-30 DIAGNOSIS — G8929 Other chronic pain: Secondary | ICD-10-CM

## 2022-10-30 NOTE — Patient Instructions (Signed)
Dolor de espalda crnico Chronic Back Pain El dolor de espalda crnico es aquel que dura ms de 3 meses. Es posible que se desconozca la causa de esta afeccin. Algunas causas frecuentes son las siguientes: Desgaste normal (enfermedad degenerativa) de los [place], los discos o los tejidos que conectan los [employer] s (ligamentos) de la espalda. Inflamacin y rigidez en la espalda (artritis). Si tiene dolor de espalda crnico, puede haber momentos en los que el dolor sea ms intenso (exacerbaciones). Usted tambin puede aprender a Financial controller con el cuidado Financial planner. Siga estas instrucciones en su casa: Controle si hay algn cambio en sus sntomas. Tome estas medidas para Theatre stage manager dolor: Control del dolor y la rigidez     Si se lo indican, aplique hielo sobre la zona dolorida. Pueden indicarle que se aplique hielo durante las primeras 24 a 48 horas luego del comienzo de Physiological scientist. Ponga el hielo en una bolsa plstica. Coloque una toalla entre la piel y Therapist, nutritional. Aplique el hielo durante 20 minutos, 2 o 3 veces por da. Si se lo indican, aplique calor en la zona afectada con la frecuencia que le haya indicado el mdico. Use la fuente de calor que el mdico le recomiende, como una compresa de calor hmedo o una almohadilla trmica. Coloque una toalla entre la piel y la fuente de Freight forwarder. Aplique calor durante 20 a 30 minutos. Si la piel se le pone de color rojo brillante, retire el hielo o Nurse, children's de inmediato para evitar daos en la piel. El [place] de dao es mayor si no puede sentir dolor, Freight forwarder o fro. Intente tomar un bao de inmersin con agua caliente. Actividad        Evite inclinarse y Optometrist otras actividades que Sales executive. Mantenga una buena postura cuando est de pie o sentado. Cuando est de pie, mantenga la parte alta de la espalda y el cuello rectos, con los hombros [place]. Evite encorvarse. Cuando est sentado, mantenga la espalda recta.  Relaje los hombros. No curve los hombros ni los [employer]. No permanezca sentado o de pie en el mismo lugar [employer]. Durante el da, descanse durante lapsos breves. Esto le Best boy. Descansar recostado o de pie suele ser mejor que hacerlo sentado. Cuando descanse durante perodos ms largos, incorpore alguna [place] o ejercicios de [employer] perodos de descanso. Esto ayudar a Mining engineer rigidez y Conservation officer, historic buildings. Haga ejercicio con regularidad. Pregntele al mdico qu actividades son seguras para usted. Es posible que Dance movement psychotherapist objetos. Pregntele al mdico cunto peso puede levantar sin correr [employer]. Si levanta objetos, use siempre la tcnica correcta. Esto significa que debe hacer lo siguiente: Flexione las rodillas. Mantenga la carga cerca del cuerpo. No gire el cuerpo. Medicamentos Use los medicamentos de venta libre y los recetados solo como se lo haya indicado el mdico. Es posible que deba usar analgsicos para Conservation officer, historic buildings y la inflamacin. Estos pueden tomarse por boca o colocarse sobre la piel. Tambin pueden darle relajantes musculares. Pregntele al mdico si el medicamento que se le recet: Requiere que evite conducir o usar [place]. Puede causarle estreimiento. Es posible que tenga que tomar estas medidas para prevenir o tratar el estreimiento: Electronics engineer suficiente lquido para Contractor pis (orina) de color amarillo plido. Usar medicamentos recetados o de Radio broadcast assistant. Consumir alimentos ricos en fibra, como frijoles, cereales integrales, y frutas y verduras frescas. Limitar el consumo de alimentos ricos en grasa  y azcares procesados, como los alimentos fritos o dulces. Instrucciones generales  Duerma sobre un colchn firme en una posicin cmoda. Intente acostarse de costado, con las rodillas ligeramente flexionadas. Si se recuesta [employer], coloque una almohada debajo de las rodillas. No consuma ningn producto que  contenga nicotina o tabaco. Estos productos incluyen cigarrillos, tabaco para Higher education careers adviser y aparatos de vapeo, como los Psychologist, sport and exercise. Si necesita ayuda para dejar de fumar, consulte al mdico. Comunquese con un mdico si: Tiene un dolor que no mejora con descanso ni medicamentos. Siente un dolor nuevo. Tiene fiebre. Pierde peso con rapidez. Tiene dificultad para [employer] cotidianas. Siente debilidad o adormecimiento en una o ambas piernas, o en uno o ambos pies. Solicite ayuda de inmediato si: No puede controlar la miccin o la defecacin. Siente dolor intenso en la espalda y: Nuseas o vmitos. Dolor en el pecho o el abdomen. Falta de aire. Se desmaya. Estos sntomas pueden Sales executive. Solicite ayuda de inmediato. Llame al 911. No espere a ver si los sntomas desaparecen. No conduzca por sus propios medios Principal Financial. Esta informacin no tiene Marine scientist el consejo del mdico. Asegrese de hacerle al mdico cualquier pregunta que tenga. Document Revised: 05/08/2022 Document Reviewed: 05/08/2022 Elsevier Patient Education  [place].

## 2022-10-30 NOTE — Progress Notes (Signed)
   10/30/22 4:04 PM   Camya R Meinecke Apr 12, 1986 [id number]  CC: Back pain, nephrolithiasis  HPI: 37 year old female with multiple recent ER visits for back pain, originally on the right side, then on the left side who was found to have a nonobstructing 8 mm left upper pole renal stone and was referred to urology.  She reports her pain has improved over the last few weeks.  Her pain seems to be worse on the left side with physical activity, movement, or heavy lifting.  She denies any prior kidney stone episodes, denies any hematuria or urinary symptoms.  Urine culture from the ED was negative.  Notably, she has had multiple CTs previously in the [facility] system, and the 8 mm left upper pole renal stone has been stable since at least 2013.   PMH: Past Medical History:  Diagnosis Date   Hypertension     Surgical History: Past Surgical History:  Procedure Laterality Date   NO PAST SURGERIES        Family History: Family History  Adopted: Yes    Social History:  reports that she has never smoked. She has been exposed to tobacco smoke. She has never used smokeless tobacco. She reports that she does not drink alcohol and does not use drugs.  Physical Exam: BP (!) 126/91   Pulse (!) 112   Ht 5' 3"$  (1.6 m)   Wt 190 lb (86.2 kg)   BMI 33.66 kg/m    Constitutional:  Alert and oriented, No acute distress. Cardiovascular: No clubbing, cyanosis, or edema. Respiratory: Normal respiratory effort, no increased work of breathing. GI: Abdomen is soft, nontender, nondistended, no abdominal masses   Pertinent Imaging: I have personally viewed and interpreted the recent CT x 2, as well as the prior CTs from [facility] showing a stable 8 mm nonobstructive left upper pole renal stone, unchanged from 2013, no hydronephrosis.  Assessment & Plan:   37 year old female with intermittent back pain, primarily worse with physical activity and heavy lifting, likely incidental finding of a stable 8 mm  nonobstructive left upper pole stone that is unchanged on multiple CT scan since 2013.  Referral placed to physical therapy for low back pain likely related to musculoskeletal issues  Stone prevention strategies discussed   Follow-up with urology as needed  Nickolas Madrid, MD 10/30/2022  [place] [address], [place], [place] [address] [phone]

## 2022-10-31 ENCOUNTER — Encounter (INDEPENDENT_AMBULATORY_CARE_PROVIDER_SITE_OTHER): Payer: Self-pay | Admitting: Vascular Surgery

## 2022-10-31 ENCOUNTER — Ambulatory Visit (INDEPENDENT_AMBULATORY_CARE_PROVIDER_SITE_OTHER): Payer: Medicaid Other | Admitting: Vascular Surgery

## 2022-10-31 VITALS — BP 120/89 | HR 90 | Resp 16 | Wt 193.0 lb

## 2022-10-31 DIAGNOSIS — I83811 Varicose veins of right lower extremities with pain: Secondary | ICD-10-CM

## 2022-10-31 DIAGNOSIS — I83819 Varicose veins of unspecified lower extremities with pain: Secondary | ICD-10-CM

## 2022-10-31 NOTE — Progress Notes (Signed)
    MRN : [id number]  Theresa Stevens is a 37 y.o. (April 08, 1986) female who presents with chief complaint of No chief complaint on file. .    The patient's right lower extremity was sterilely prepped and draped.  The ultrasound machine was used to visualize the right great saphenous vein throughout its course.  A segment above the knee was selected for access.  The saphenous vein was accessed without difficulty using ultrasound guidance with a micropuncture needle.   An 0.018  wire was placed beyond the saphenofemoral junction through the sheath and the microneedle was removed.  The 65 cm sheath was then placed over the wire and the wire and dilator were removed.  The laser fiber was placed through the sheath and its tip was placed approximately 2 cm below the saphenofemoral junction.  Tumescent anesthesia was then created with a dilute lidocaine solution.  Laser energy was then delivered with constant withdrawal of the sheath and laser fiber.  Approximately 1384 joules of energy were delivered over a length of 20 cm.  Sterile dressings were placed.  The patient tolerated the procedure well without complications.

## 2022-11-06 ENCOUNTER — Other Ambulatory Visit (INDEPENDENT_AMBULATORY_CARE_PROVIDER_SITE_OTHER): Payer: Self-pay | Admitting: Vascular Surgery

## 2022-11-06 DIAGNOSIS — I83813 Varicose veins of bilateral lower extremities with pain: Secondary | ICD-10-CM

## 2022-11-07 ENCOUNTER — Ambulatory Visit (INDEPENDENT_AMBULATORY_CARE_PROVIDER_SITE_OTHER): Payer: Medicaid Other

## 2022-11-07 DIAGNOSIS — I83813 Varicose veins of bilateral lower extremities with pain: Secondary | ICD-10-CM

## 2022-11-15 ENCOUNTER — Other Ambulatory Visit: Payer: Self-pay

## 2022-11-15 DIAGNOSIS — B181 Chronic viral hepatitis B without delta-agent: Secondary | ICD-10-CM

## 2022-11-28 ENCOUNTER — Encounter (INDEPENDENT_AMBULATORY_CARE_PROVIDER_SITE_OTHER): Payer: Self-pay | Admitting: Nurse Practitioner

## 2022-11-28 ENCOUNTER — Ambulatory Visit (INDEPENDENT_AMBULATORY_CARE_PROVIDER_SITE_OTHER): Payer: Medicaid Other | Admitting: Nurse Practitioner

## 2022-11-28 VITALS — BP 128/86 | HR 84 | Resp 16 | Wt 194.6 lb

## 2022-11-28 DIAGNOSIS — I83813 Varicose veins of bilateral lower extremities with pain: Secondary | ICD-10-CM | POA: Diagnosis not present

## 2022-11-29 ENCOUNTER — Ambulatory Visit: Admission: RE | Admit: 2022-11-29 | Payer: Medicaid Other | Source: Ambulatory Visit

## 2022-11-29 ENCOUNTER — Encounter (INDEPENDENT_AMBULATORY_CARE_PROVIDER_SITE_OTHER): Payer: Self-pay | Admitting: Nurse Practitioner

## 2022-11-29 NOTE — Progress Notes (Signed)
Subjective:    Patient ID: Theresa Stevens, female    DOB: 1986/01/06, 37 y.o.   MRN: [id number] Chief Complaint  Patient presents with   Follow-up    4 week post laser     The patient returns to the office for followup status post laser ablation of the right saphenous vein on 10/31/2022.  The patient note significant improvement in the lower extremity pain but not resolution of the symptoms. The patient notes multiple residual varicosities bilaterally which continued to hurt with dependent positions and remained tender to palpation. The patient's swelling is minimally from preoperative status. The patient continues to wear graduated compression stockings on a daily basis but these are not eliminating the pain and discomfort. The patient continues to use over-the-counter anti-inflammatory medications to treat the pain and related symptoms but this has not given the patient relief. The patient notes the pain in the lower extremities is causing problems with daily exercise, problems at work and even with household activities such as preparing meals and doing dishes.  The patient is otherwise done well and there have been no complications related to the laser procedure or interval changes in the patient's overall   Post laser ultrasound shows successful ablation of the right GSV      Review of Systems  Cardiovascular:  Positive for leg swelling.  All other systems reviewed and are negative.      Objective:   Physical Exam Vitals reviewed.  HENT:     Head: Normocephalic.  Cardiovascular:     Rate and Rhythm: Normal rate.     Pulses: Normal pulses.  Pulmonary:     Effort: Pulmonary effort is normal.  Musculoskeletal:     Right lower leg: Edema present.  Skin:    General: Skin is warm and dry.  Neurological:     Mental Status: She is alert and oriented to person, place, and time.  Psychiatric:        Mood and Affect: Mood normal.        Behavior: Behavior normal.        Thought  Content: Thought content normal.        Judgment: Judgment normal.     BP 128/86 (BP Location: Right Arm)   Pulse 84   Resp 16   Wt 194 lb 9.6 oz (88.3 kg)   BMI 34.47 kg/m   Past Medical History:  Diagnosis Date   Hypertension     Social History   Socioeconomic History   Marital status: Married    Spouse name: Not on file   Number of children: Not on file   Years of education: Not on file   Highest education level: Not on file  Occupational History   Not on file  Tobacco Use   Smoking status: Never    Passive exposure: Past   Smokeless tobacco: Never  Vaping Use   Vaping Use: Never used  Substance and Sexual Activity   Alcohol use: No   Drug use: No   Sexual activity: Not on file  Other Topics Concern   Not on file  Social History Narrative   Not on file   Social Determinants of Health   Financial Resource Strain: Not on file  Food Insecurity: Not on file  Transportation Needs: Not on file  Physical Activity: Not on file  Stress: Not on file  Social Connections: Not on file  Intimate Partner Violence: Not on file    Past Surgical History:  Procedure Laterality  Date   NO PAST SURGERIES      Family History  Adopted: Yes    No Known Allergies     Latest Ref Rng & Units 09/20/2022    5:11 PM 09/14/2022   11:05 AM 08/08/2017    4:32 PM  CBC  WBC 4.0 - 10.5 K/uL 12.3  11.4  12.7   Hemoglobin 12.0 - 15.0 g/dL 12.6  14.6  13.0   Hematocrit 36.0 - 46.0 % 38.5  45.5  39.2   Platelets 150 - 400 K/uL 358  399  354       CMP     Component Value Date/Time   NA 135 09/20/2022 1711   K 3.4 (L) 09/20/2022 1711   CL 103 09/20/2022 1711   CO2 25 09/20/2022 1711   GLUCOSE 133 (H) 09/20/2022 1711   BUN 17 09/20/2022 1711   CREATININE 0.79 09/20/2022 1711   CALCIUM 9.0 09/20/2022 1711   PROT 6.9 09/20/2022 1711   ALBUMIN 3.8 09/20/2022 1711   AST 15 09/20/2022 1711   ALT 15 09/20/2022 1711   ALKPHOS 72 09/20/2022 1711   BILITOT 0.4 09/20/2022 1711    GFRNONAA >60 09/20/2022 1711   GFRAA >60 08/08/2017 1632     No results found.     Assessment & Plan:   1. Varicose veins of bilateral lower extremities with pain Recommend:  The patient has had successful ablation of the previously incompetent saphenous venous system but still has persistent symptoms of pain and swelling that are having a negative impact on daily life and daily activities.  Patient should undergo injection foam sclerotherapy to treat the residual varicosities.  The risks, benefits and alternative therapies were reviewed in detail with the patient.  All questions were answered.  The patient agrees to proceed with sclerotherapy at their convenience.  The patient will continue wearing the graduated compression stockings and using the over-the-counter pain medications to treat her symptoms.       Current Outpatient Medications on File Prior to Visit  Medication Sig Dispense Refill   ibuprofen (ADVIL) 200 MG tablet Take 200 mg by mouth every 6 (six) hours as needed for mild pain.     ondansetron (ZOFRAN-ODT) 4 MG disintegrating tablet Take 1 tablet (4 mg total) by mouth every 8 (eight) hours as needed for nausea or vomiting. 12 tablet 0   spironolactone (ALDACTONE) 50 MG tablet Take by mouth.     No current facility-administered medications on file prior to visit.    There are no Patient Instructions on file for this visit. No follow-ups on file.   Kris Hartmann, NP

## 2023-01-19 IMAGING — US US EXTREM LOW VENOUS*R*
1 series · 13 of 24 positions shown · non-contrast
Comparison: None.

CLINICAL DATA: Right lower extremity pain and history of varicose
veins.



[Series 1: us venous img lower uni right (dvt) · portal-venous · 13 of 36 slices shown]
[im 1/36]
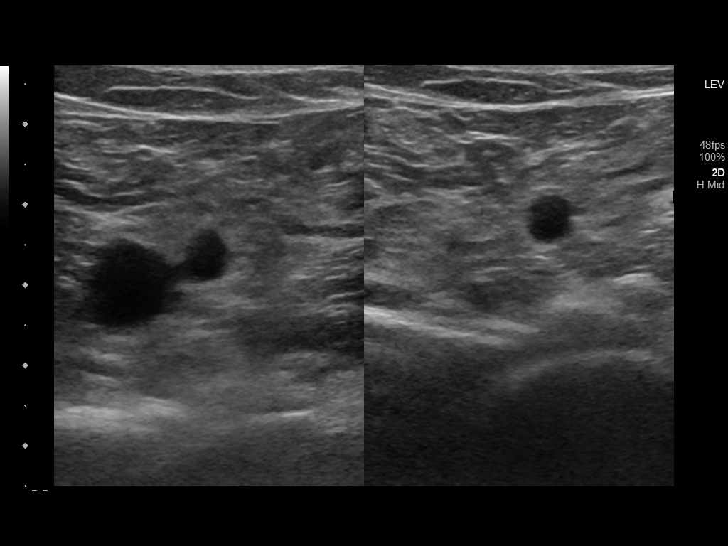
[im 4/36]
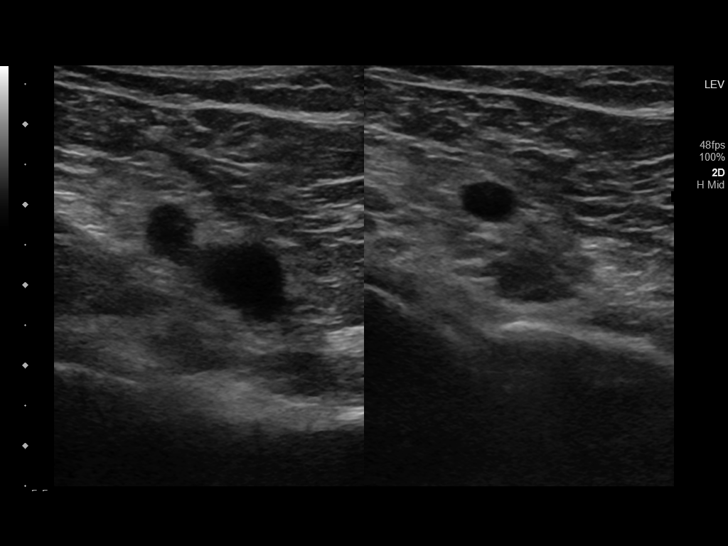
[im 7/36]
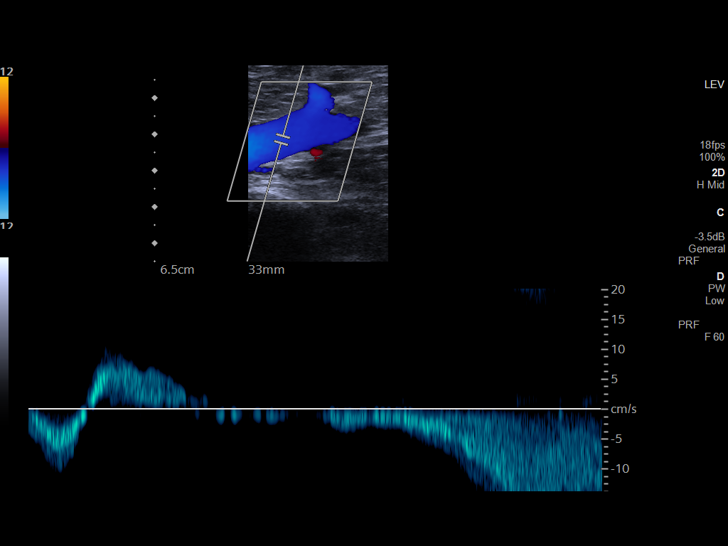
[im 10/36]
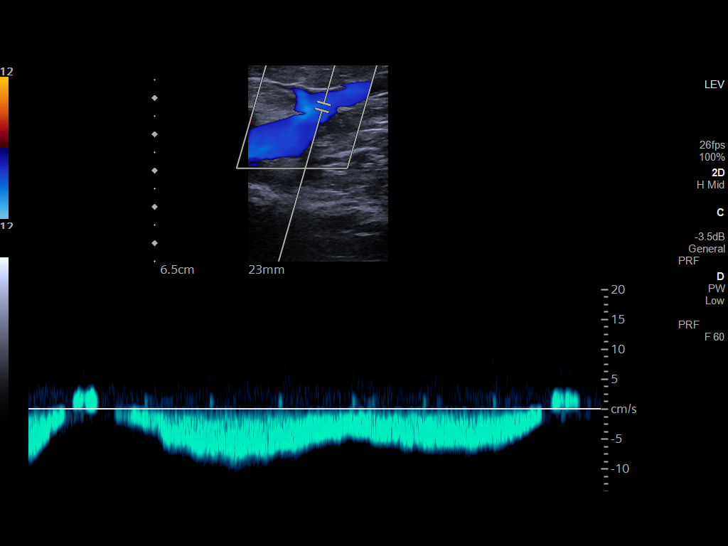
[im 13/36]
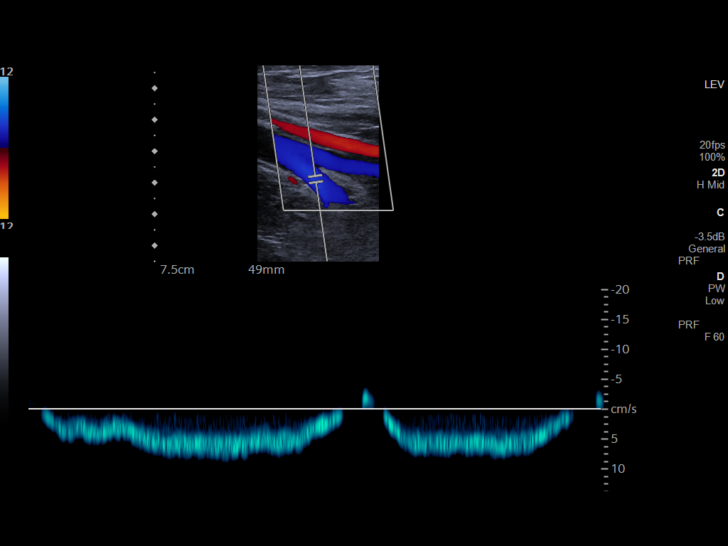
[im 16/36]
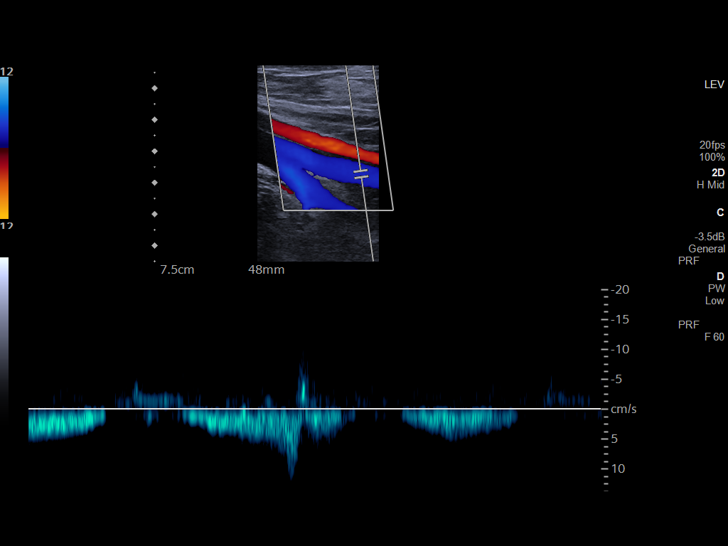
[im 19/36]
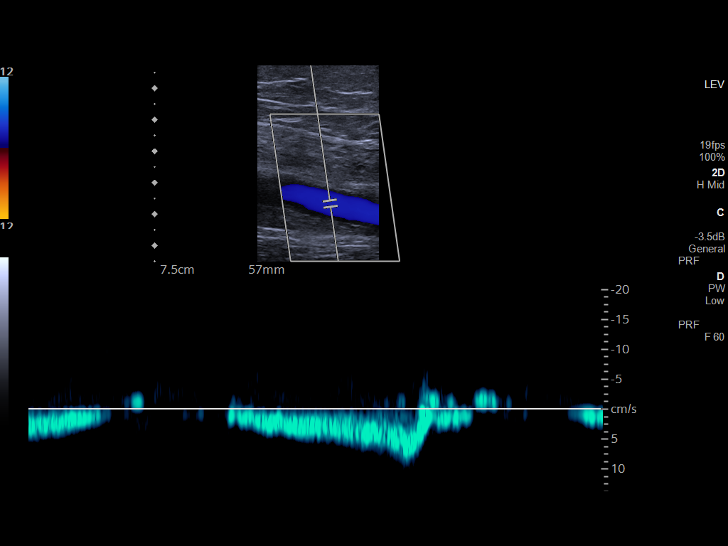
[im 20/36]
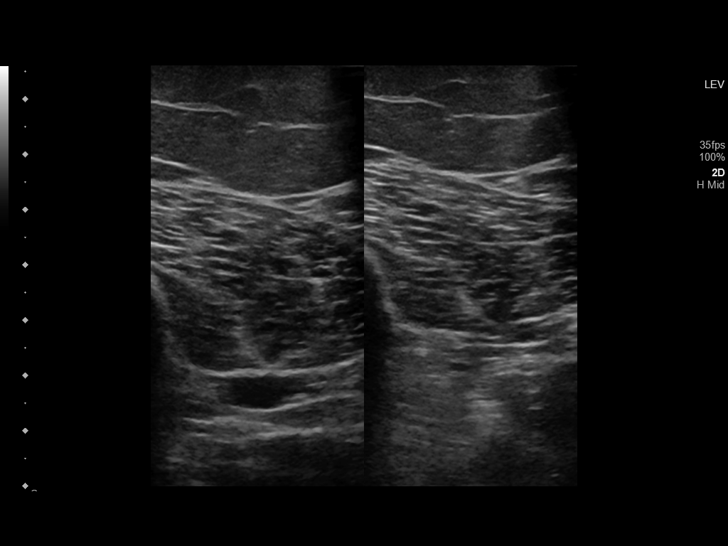
[im 23/36]
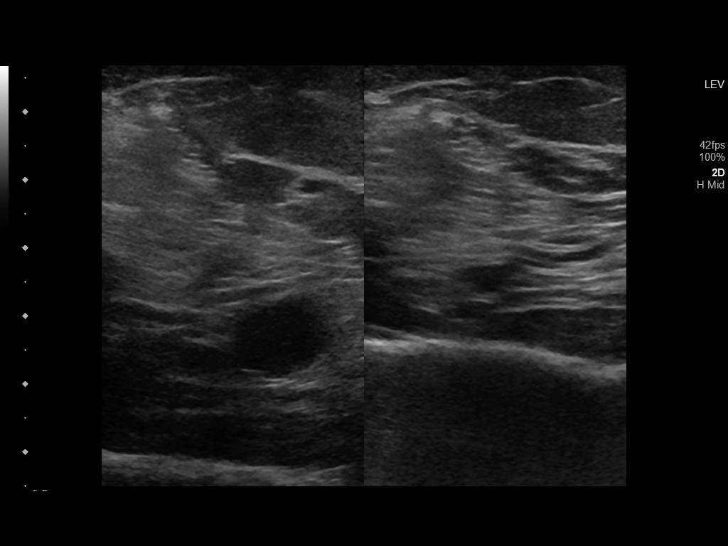
[im 26/36]
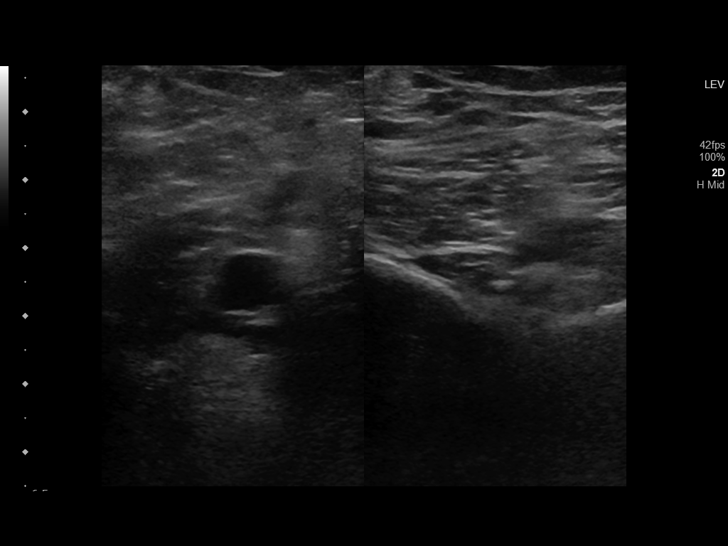
[im 29/36]
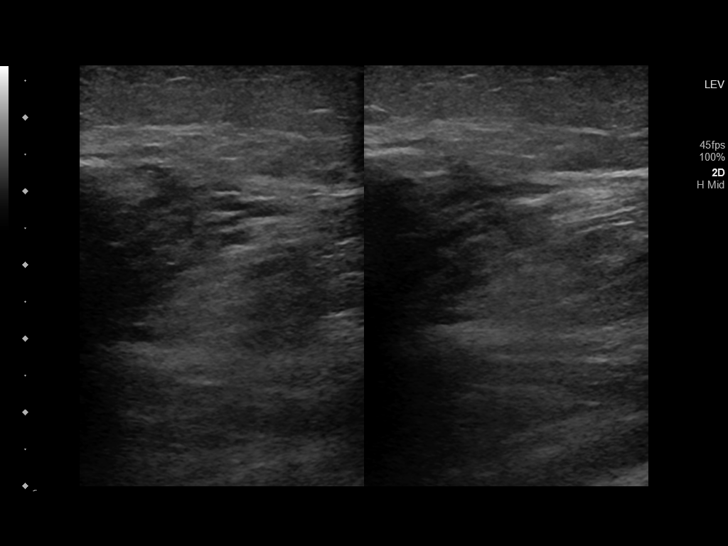
[im 32/36]
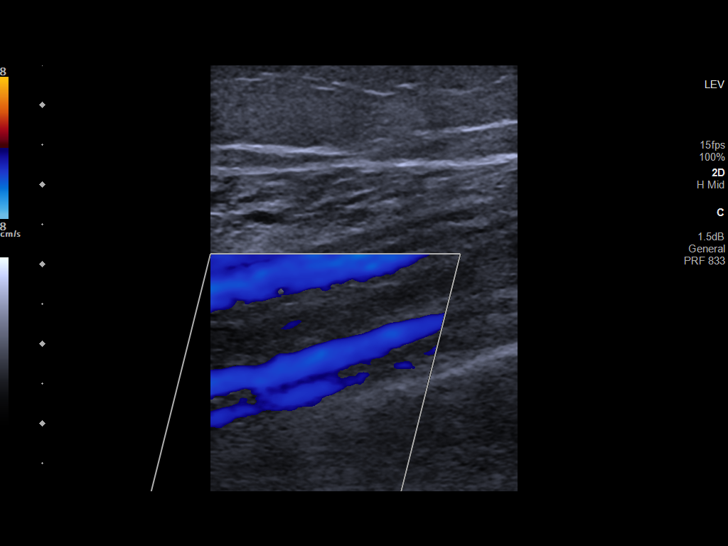
[im 36/36]
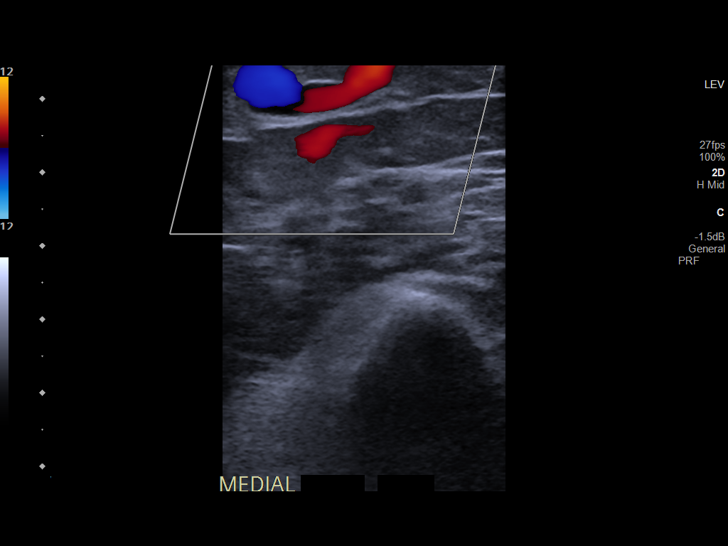

[13 of 24 positions shown; findings below may reference images not displayed]

FINDINGS: Contralateral Common Femoral Vein: Respiratory phasicity is normal
and symmetric with the symptomatic side. No evidence of thrombus.
Normal compressibility.

Common Femoral Vein: No evidence of thrombus. Normal
compressibility, respiratory phasicity and response to augmentation.

Saphenofemoral Junction: No evidence of thrombus. Normal
compressibility and flow on color Doppler imaging.

Profunda Femoral Vein: No evidence of thrombus. Normal
compressibility and flow on color Doppler imaging.

Femoral Vein: No evidence of thrombus. Normal compressibility,
respiratory phasicity and response to augmentation.

Popliteal Vein: No evidence of thrombus. Normal compressibility,
respiratory phasicity and response to augmentation.

Calf Veins: No evidence of thrombus. Normal compressibility and flow
on color Doppler imaging.

Superficial Great Saphenous Vein: No evidence of thrombus. Normal
compressibility.

Venous Reflux:  None.

Other Findings: Visible varicosities of the medial thigh that
compress and demonstrate no visible thrombosis. No evidence of
superficial thrombophlebitis or abnormal fluid collection.
IMPRESSION: No evidence of right lower extremity deep venous thrombosis. Patent
varicosities of the medial thigh without evidence of superficial
thrombophlebitis.

## 2023-02-05 IMAGING — US US ABDOMEN LIMITED
1 series · 14 of 25 positions shown · non-contrast
Comparison: Right upper quadrant ultrasound dated 11/03/2018.

CLINICAL DATA: Hepatitis-B.

EXAM:
ULTRASOUND ABDOMEN LIMITED RIGHT UPPER QUADRANT

[Series 1: us abdomen limited · 0.22mm/px · 14 of 49 slices shown]
[im 1/49]
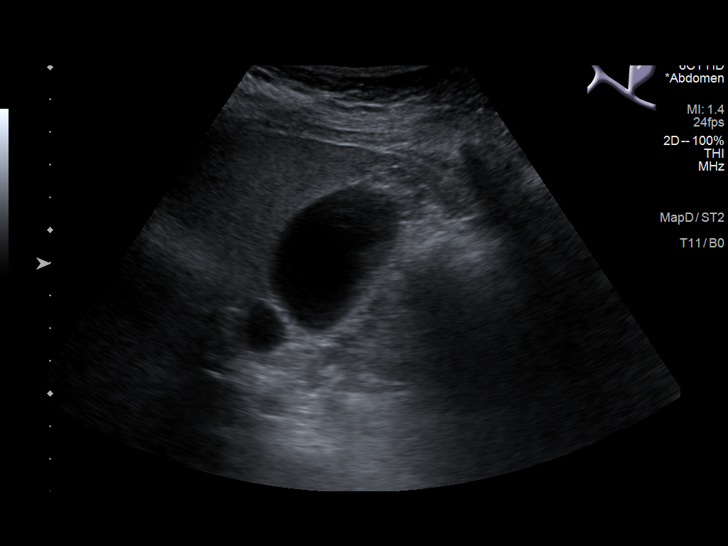
[im 5/49]
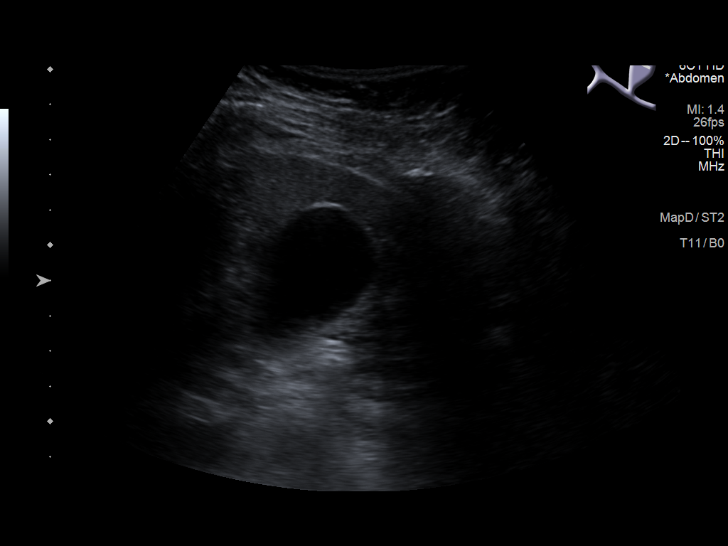
[im 9/49]
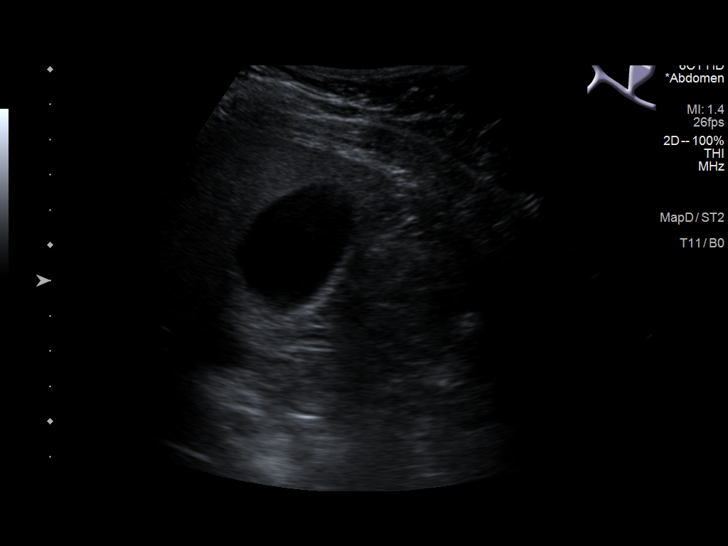
[im 13/49]
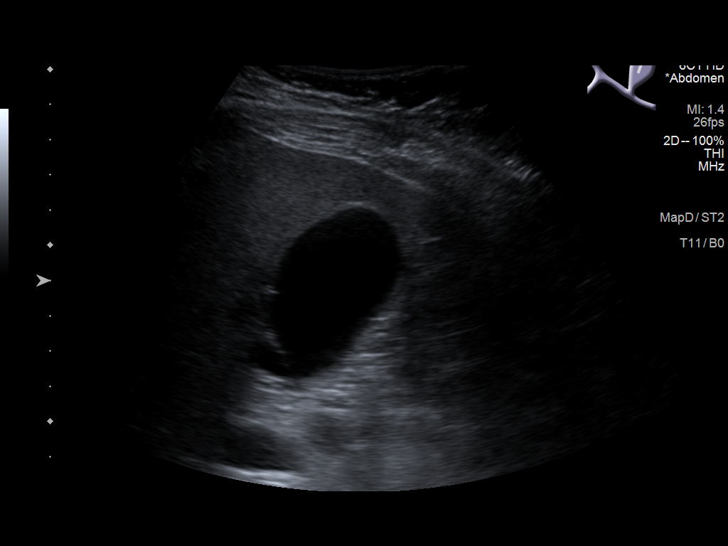
[im 17/49]
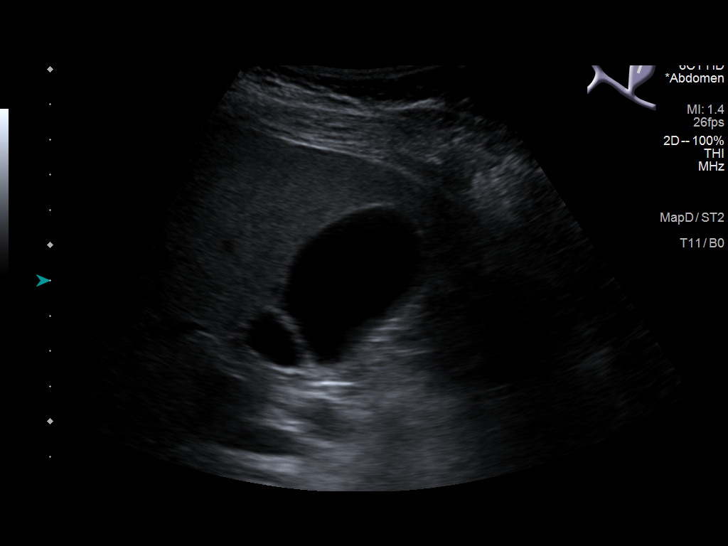
[im 19/49]
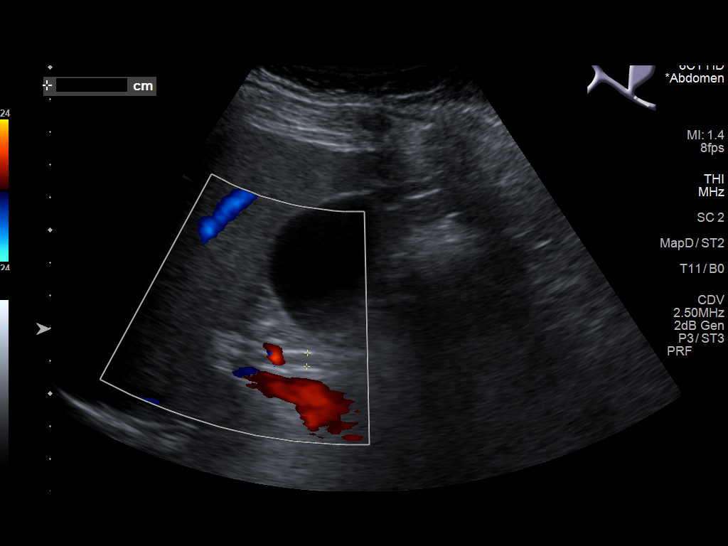
[im 23/49]
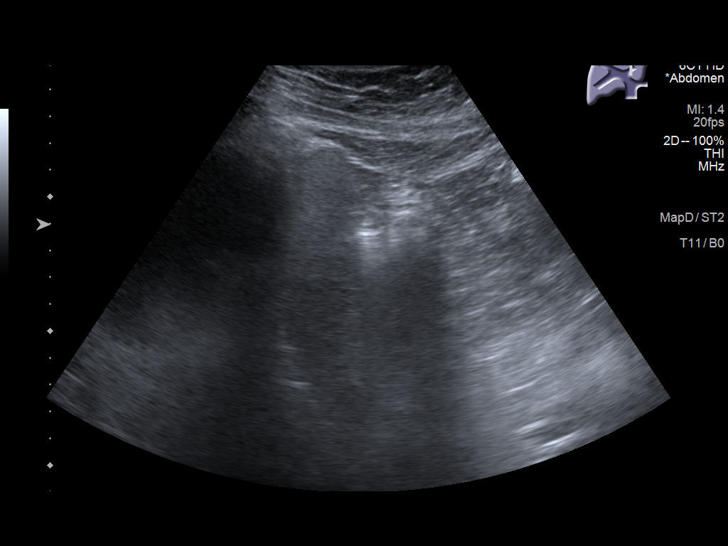
[im 27/49]
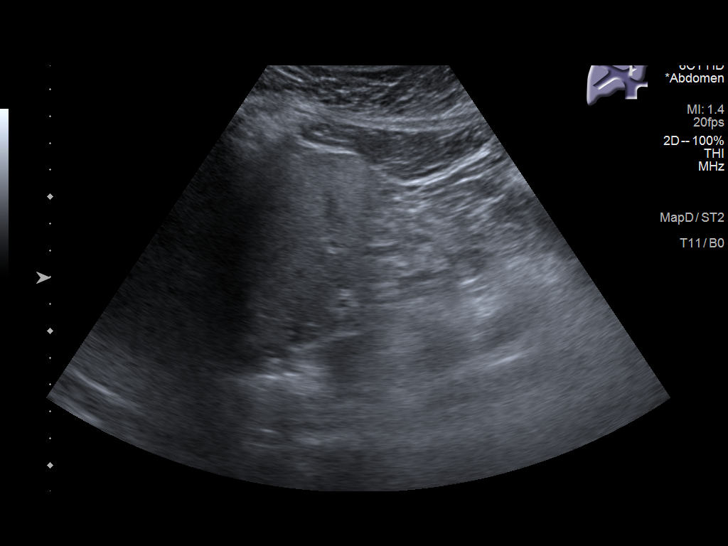
[im 31/49]
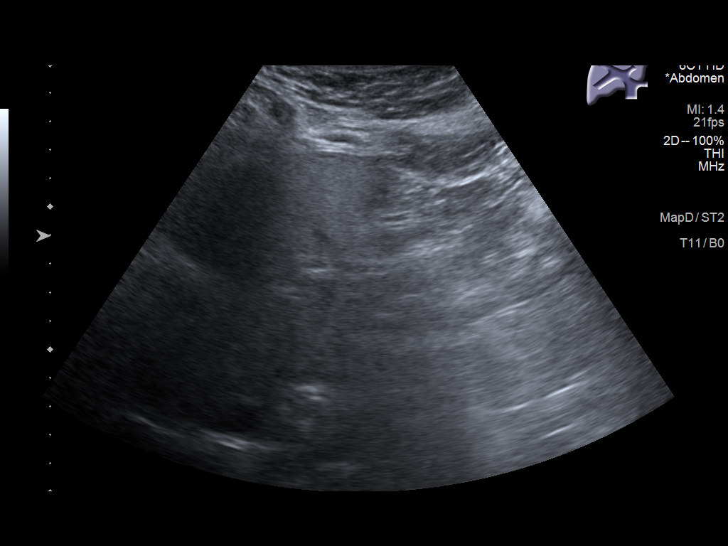
[im 33/49]
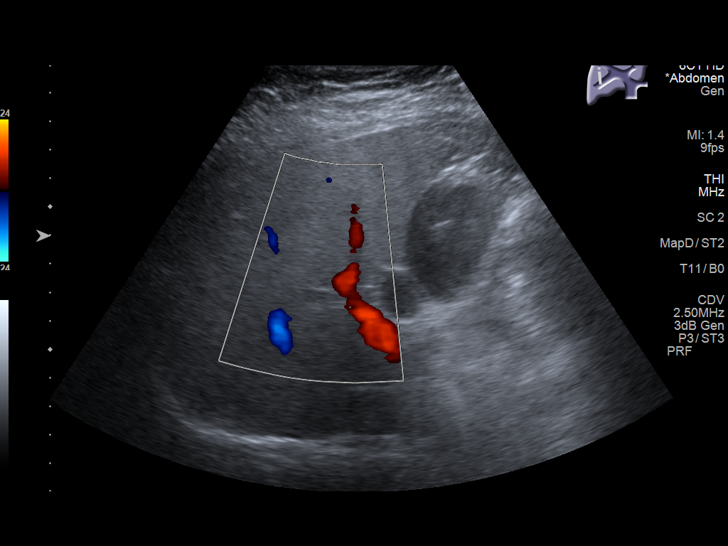
[im 37/49]
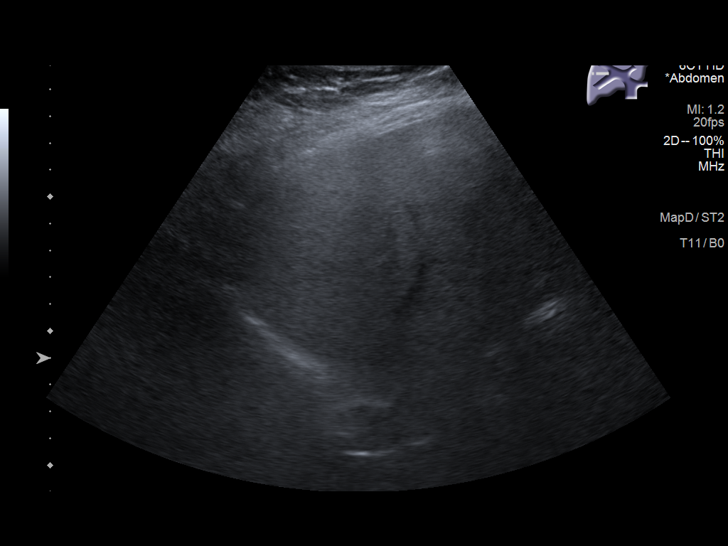
[im 41/49]
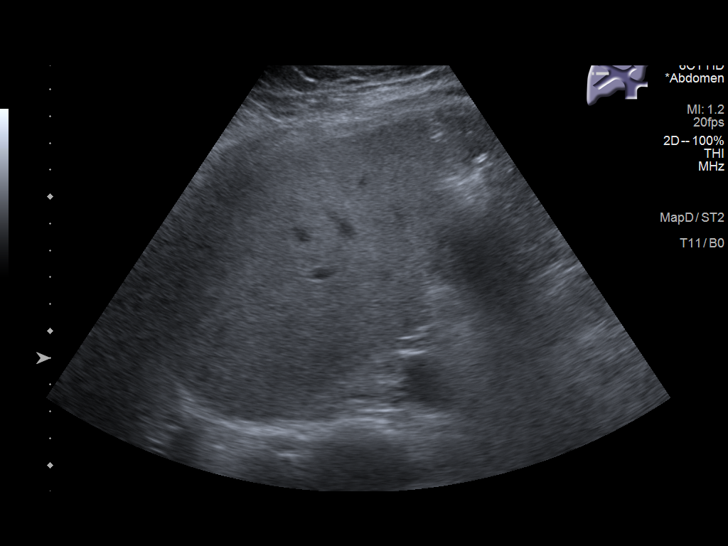
[im 45/49]
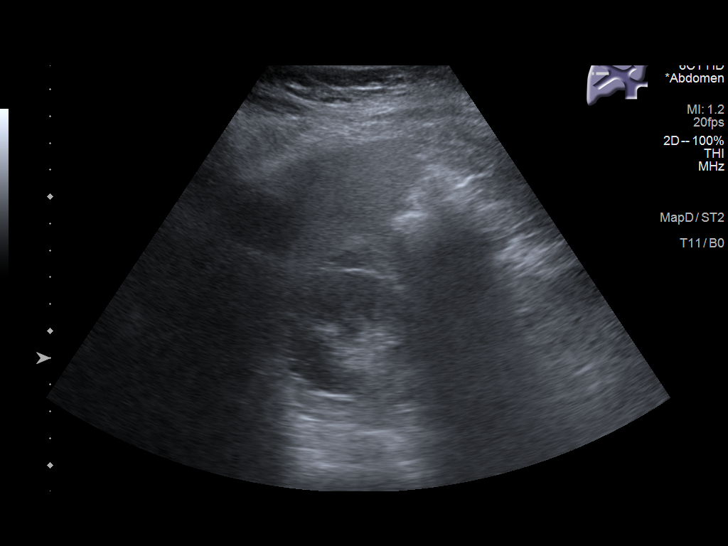
[im 49/49]
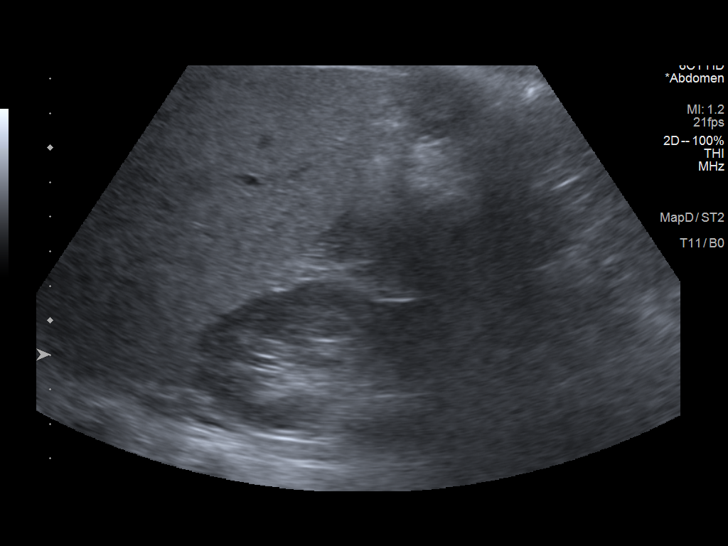

[14 of 25 positions shown; findings below may reference images not displayed]

FINDINGS: Gallbladder:

No gallstones or wall thickening visualized. No sonographic Murphy
sign noted by sonographer.

Common bile duct:

Diameter: 4 mm

Liver:

There is diffuse increased liver echogenicity most commonly seen in
the setting of fatty infiltration. Superimposed inflammation or
fibrosis is not excluded. Clinical correlation is recommended.
Portal vein is patent on color Doppler imaging with normal direction
of blood flow towards the liver.

Other: None.
IMPRESSION: Fatty liver, otherwise unremarkable right upper quadrant ultrasound.

## 2023-02-26 ENCOUNTER — Telehealth (INDEPENDENT_AMBULATORY_CARE_PROVIDER_SITE_OTHER): Payer: Self-pay | Admitting: Vascular Surgery

## 2023-02-26 NOTE — Telephone Encounter (Signed)
Via interpreter services, ATC pt to schedle FOAM sclero appt. No VM set up and unable to LM.  right leg FOAM sclero. see jd. auth # [id number] exp: 6.17.24 - 7.17.24   NEED TO SCHEDULE BEFORE 7.17.24 OR I WILL NEED TO DO ANOTHER PA FOR EXPIRATION DATE.

## 2023-05-01 ENCOUNTER — Other Ambulatory Visit: Payer: Self-pay | Admitting: Gastroenterology

## 2023-05-01 DIAGNOSIS — B181 Chronic viral hepatitis B without delta-agent: Secondary | ICD-10-CM

## 2023-05-07 ENCOUNTER — Ambulatory Visit
Admission: RE | Admit: 2023-05-07 | Discharge: 2023-05-07 | Disposition: A | Discharge status: Home / Self Care | Payer: Medicaid Other | Source: Ambulatory Visit | Attending: Gastroenterology | Admitting: Gastroenterology

## 2023-05-07 DIAGNOSIS — B181 Chronic viral hepatitis B without delta-agent: Secondary | ICD-10-CM

## 2023-08-17 ENCOUNTER — Other Ambulatory Visit: Payer: Self-pay

## 2023-08-17 ENCOUNTER — Emergency Department: Payer: Medicaid Other

## 2023-08-17 ENCOUNTER — Emergency Department
Admission: EM | Admit: 2023-08-17 | Discharge: 2023-08-17 | Disposition: A | Discharge status: Home / Self Care | Payer: Medicaid Other | Attending: Emergency Medicine | Admitting: Emergency Medicine

## 2023-08-17 DIAGNOSIS — Z20822 Contact with and (suspected) exposure to covid-19: Secondary | ICD-10-CM | POA: Diagnosis not present

## 2023-08-17 DIAGNOSIS — R0789 Other chest pain: Secondary | ICD-10-CM | POA: Diagnosis present

## 2023-08-17 LAB — CBC
HCT: 42.1 % (ref 36.0–46.0)
Hemoglobin: 14 g/dL (ref 12.0–15.0)
MCH: 27.9 pg (ref 26.0–34.0)
MCHC: 33.3 g/dL (ref 30.0–36.0)
MCV: 83.9 fL (ref 80.0–100.0)
Platelets: 366 10*3/uL (ref 150–400)
RBC: 5.02 MIL/uL (ref 3.87–5.11)
RDW: 13.1 % (ref 11.5–15.5)
WBC: 10.2 10*3/uL (ref 4.0–10.5)
nRBC: 0 % (ref 0.0–0.2)

## 2023-08-17 LAB — BASIC METABOLIC PANEL
Anion gap: 7 (ref 5–15)
BUN: 14 mg/dL (ref 6–20)
CO2: 24 mmol/L (ref 22–32)
Calcium: 9.2 mg/dL (ref 8.9–10.3)
Chloride: 104 mmol/L (ref 98–111)
Creatinine, Ser: 0.93 mg/dL (ref 0.44–1.00)
GFR, Estimated: >60 mL/min (ref >60)
Glucose, Bld: 137 mg/dL — ABNORMAL HIGH (ref 70–99)
Potassium: 3.4 mmol/L — ABNORMAL LOW (ref 3.5–5.1)
Sodium: 135 mmol/L (ref 135–145)

## 2023-08-17 LAB — RESP PANEL BY RT-PCR (RSV, FLU A&B, COVID)  RVPGX2
Influenza A by PCR: NEGATIVE
Influenza B by PCR: NEGATIVE
Resp Syncytial Virus by PCR: NEGATIVE
SARS Coronavirus 2 by RT PCR: NEGATIVE

## 2023-08-17 LAB — TROPONIN I (HIGH SENSITIVITY): Troponin I (High Sensitivity): 3 ng/L (ref <18)

## 2023-08-17 MED ORDER — LIDOCAINE VISCOUS HCL 2 % MT SOLN
15.0000 mL | Freq: Once | OROMUCOSAL | Status: AC
  Administered 2023-08-17: 15 mL via ORAL
  Filled 2023-08-17: qty 15

## 2023-08-17 MED ORDER — IOHEXOL 350 MG/ML SOLN
75.0000 mL | Freq: Once | INTRAVENOUS | Status: AC | PRN
  Administered 2023-08-17: 75 mL via INTRAVENOUS

## 2023-08-17 MED ORDER — PANTOPRAZOLE SODIUM 40 MG PO TBEC: 40.0000 mg | DELAYED_RELEASE_TABLET | Freq: Every day | ORAL | 1 refills | Status: DC

## 2023-08-17 MED ORDER — ALUM & MAG HYDROXIDE-SIMETH 200-200-20 MG/5ML PO SUSP
30.0000 mL | Freq: Once | ORAL | Status: AC
  Administered 2023-08-17: 30 mL via ORAL
  Filled 2023-08-17: qty 30

## 2023-08-17 NOTE — ED Provider Notes (Signed)
[facility] Provider Note    Event Date/Time   First MD Initiated Contact with Patient 08/17/23 1636     (approximate)   History   Chest pain  HPI  Theresa Stevens is a 37 y.o. female with no significant past medical history who presents with complaints of chest discomfort which she describes intermittent.  Worse at night, worse after eating, describes it as a burning pain in the center of her chest.  No shortness of breath, no pleurisy.  No calf pain or swelling, no fevers chills or cough, has a scratchy throat also     Physical Exam   Triage Vital Signs: ED Triage Vitals  Encounter Vitals Group     BP 08/17/23 1558 136/79     Systolic BP Percentile --      Diastolic BP Percentile --      Pulse Rate 08/17/23 1558 (!) 119     Resp 08/17/23 1558 18     Temp 08/17/23 1558 98.2 F (36.8 C)     Temp src --      SpO2 08/17/23 1558 100 %     Weight 08/17/23 1557 81.6 kg (180 lb)     Height 08/17/23 1557 1.6 m (5\' 3" )     Head Circumference --      Peak Flow --      Pain Score 08/17/23 1557 8     Pain Loc --      Pain Education --      Exclude from Growth Chart --     Most recent vital signs: Vitals:   08/17/23 1558  BP: 136/79  Pulse: (!) 119  Resp: 18  Temp: 98.2 F (36.8 C)  SpO2: 100%     General: Awake, no distress.  CV:  Good peripheral perfusion.  Regular rate and rhythm, no murmurs, 2+ pulses bilateral upper extremities, equal.  No chest wall tenderness palpation Resp:  Normal effort.  Clear to auscultation bilaterally Abd:  No distention.  Soft, nontender Other:  No calf pain or swelling   ED Results / Procedures / Treatments   Labs (all labs ordered are listed, but only abnormal results are displayed) Labs Reviewed  BASIC METABOLIC PANEL - Abnormal; Notable for the following components:      Result Value   Potassium 3.4 (*)    Glucose, Bld 137 (*)    All other components within normal limits  RESP PANEL BY RT-PCR (RSV,  FLU A&B, COVID)  [id number]  CBC  POC URINE PREG, ED  TROPONIN I (HIGH SENSITIVITY)     EKG  ED ECG REPORT I, Jene Every, the attending physician, personally viewed and interpreted this ECG.  Date: 08/17/2023  Rhythm: normal sinus rhythm QRS Axis: normal Intervals: normal ST/T Wave abnormalities: normal Narrative Interpretation: no evidence of acute ischemia    RADIOLOGY Chest x-ray viewed interpret by me, no pneumonia    PROCEDURES:  Critical Care performed:   Procedures   MEDICATIONS ORDERED IN ED: Medications  alum & mag hydroxide-simeth (MAALOX/MYLANTA) 200-200-20 MG/5ML suspension 30 mL (has no administration in time range)    And  lidocaine (XYLOCAINE) 2 % viscous mouth solution 15 mL (has no administration in time range)     IMPRESSION / MDM / ASSESSMENT AND PLAN / ED COURSE  I reviewed the triage vital signs and the nursing notes. Patient's presentation is most consistent with acute presentation with potential threat to life or bodily function.  Patient presents with chest pain  as detailed above  Suspicion for esophagitis/GERD given burning in the chest, worse at night, irritation in the throat.  Will trial GI cocktail given reassuring labs, not consistent with ACS given normal troponin, EKG does demonstrate tachycardia.  No pleurisy or shortness of breath to suggest PE  No significant improvement with GI cocktail, will send for CT angiography given her reports of chest pain rating to the back now.  CT scan negative for acute abnormality, not consistent with ACS or AAS, do strongly suspect esophagitis, will start the patient on PPI, cardiology referral, no indication for admission at this time as she was feeling improved      FINAL CLINICAL IMPRESSION(S) / ED DIAGNOSES   Final diagnoses:  None     Rx / DC Orders   ED Discharge Orders     None        Note:  This document was prepared using Dragon voice recognition software and may  include unintentional dictation errors.   Jene Every, MD 08/18/23 1535

## 2023-08-17 NOTE — ED Triage Notes (Signed)
Pt comes with c/o mid sternal cp and some back pain for week. Pt states she went to pcp. Pt states she feels like congestion in her throat and not able to cough.

## 2023-09-26 ENCOUNTER — Other Ambulatory Visit: Payer: Self-pay

## 2023-09-26 ENCOUNTER — Emergency Department
Admission: EM | Admit: 2023-09-26 | Discharge: 2023-09-26 | Disposition: A | Discharge status: Home / Self Care | Payer: Medicaid Other | Attending: Emergency Medicine | Admitting: Emergency Medicine

## 2023-09-26 DIAGNOSIS — J0110 Acute frontal sinusitis, unspecified: Secondary | ICD-10-CM | POA: Diagnosis not present

## 2023-09-26 DIAGNOSIS — K2960 Other gastritis without bleeding: Secondary | ICD-10-CM | POA: Insufficient documentation

## 2023-09-26 DIAGNOSIS — R519 Headache, unspecified: Secondary | ICD-10-CM | POA: Diagnosis present

## 2023-09-26 DIAGNOSIS — Z20822 Contact with and (suspected) exposure to covid-19: Secondary | ICD-10-CM | POA: Diagnosis not present

## 2023-09-26 DIAGNOSIS — K2900 Acute gastritis without bleeding: Secondary | ICD-10-CM

## 2023-09-26 LAB — CBC WITH DIFFERENTIAL/PLATELET
Abs Immature Granulocytes: 0.04 10*3/uL (ref 0.00–0.07)
Basophils Absolute: 0.1 10*3/uL (ref 0.0–0.1)
Basophils Relative: 1 %
Eosinophils Absolute: 0.2 10*3/uL (ref 0.0–0.5)
Eosinophils Relative: 1 %
HCT: 39.6 % (ref 36.0–46.0)
Hemoglobin: 13.1 g/dL (ref 12.0–15.0)
Immature Granulocytes: 0 %
Lymphocytes Relative: 15 %
Lymphs Abs: 1.8 10*3/uL (ref 0.7–4.0)
MCH: 28 pg (ref 26.0–34.0)
MCHC: 33.1 g/dL (ref 30.0–36.0)
MCV: 84.6 fL (ref 80.0–100.0)
Monocytes Absolute: 0.6 10*3/uL (ref 0.1–1.0)
Monocytes Relative: 5 %
Neutro Abs: 9.2 10*3/uL — ABNORMAL HIGH (ref 1.7–7.7)
Neutrophils Relative %: 78 %
Platelets: 343 10*3/uL (ref 150–400)
RBC: 4.68 MIL/uL (ref 3.87–5.11)
RDW: 13.2 % (ref 11.5–15.5)
WBC: 11.9 10*3/uL — ABNORMAL HIGH (ref 4.0–10.5)
nRBC: 0 % (ref 0.0–0.2)

## 2023-09-26 LAB — BASIC METABOLIC PANEL
Anion gap: 13 (ref 5–15)
BUN: 17 mg/dL (ref 6–20)
CO2: 25 mmol/L (ref 22–32)
Calcium: 9.6 mg/dL (ref 8.9–10.3)
Chloride: 101 mmol/L (ref 98–111)
Creatinine, Ser: 0.7 mg/dL (ref 0.44–1.00)
GFR, Estimated: >60 mL/min (ref >60)
Glucose, Bld: 124 mg/dL — ABNORMAL HIGH (ref 70–99)
Potassium: 3.9 mmol/L (ref 3.5–5.1)
Sodium: 139 mmol/L (ref 135–145)

## 2023-09-26 LAB — URINALYSIS, ROUTINE W REFLEX MICROSCOPIC
Bilirubin Urine: NEGATIVE
Glucose, UA: NEGATIVE mg/dL
Hgb urine dipstick: NEGATIVE
Ketones, ur: NEGATIVE mg/dL
Leukocytes,Ua: NEGATIVE
Nitrite: NEGATIVE
Protein, ur: NEGATIVE mg/dL
Specific Gravity, Urine: 1.018 (ref 1.005–1.030)
pH: 6 (ref 5.0–8.0)

## 2023-09-26 LAB — RESP PANEL BY RT-PCR (RSV, FLU A&B, COVID)  RVPGX2
Influenza A by PCR: NEGATIVE
Influenza B by PCR: NEGATIVE
Resp Syncytial Virus by PCR: NEGATIVE
SARS Coronavirus 2 by RT PCR: NEGATIVE

## 2023-09-26 LAB — GROUP A STREP BY PCR: Group A Strep by PCR: NOT DETECTED

## 2023-09-26 LAB — PREGNANCY, URINE: Preg Test, Ur: NEGATIVE

## 2023-09-26 LAB — POC URINE PREG, ED: Preg Test, Ur: NEGATIVE

## 2023-09-26 LAB — LIPASE, BLOOD: Lipase: 23 U/L (ref 11–51)

## 2023-09-26 MED ORDER — PSEUDOEPH-BROMPHEN-DM 30-2-10 MG/5ML PO SYRP: 5.0000 mL | ORAL_SOLUTION | Freq: Four times a day (QID) | ORAL | 0 refills | Status: AC | PRN

## 2023-09-26 MED ORDER — PANTOPRAZOLE SODIUM 40 MG PO TBEC: 40.0000 mg | DELAYED_RELEASE_TABLET | Freq: Every day | ORAL | 0 refills | Status: AC

## 2023-09-26 MED ORDER — HYDROCODONE-ACETAMINOPHEN 5-325 MG PO TABS
2.0000 | ORAL_TABLET | Freq: Once | ORAL | Status: AC
Start: 2023-09-26 — End: 2023-09-26
  Administered 2023-09-26: 2 via ORAL
  Filled 2023-09-26: qty 2

## 2023-09-26 MED ORDER — PREDNISONE 20 MG PO TABS
60.0000 mg | ORAL_TABLET | Freq: Once | ORAL | Status: AC
Start: 2023-09-26 — End: 2023-09-26
  Administered 2023-09-26: 60 mg via ORAL
  Filled 2023-09-26: qty 3

## 2023-09-26 MED ORDER — AMOXICILLIN-POT CLAVULANATE ER 1000-62.5 MG PO TB12: 1.0000 | ORAL_TABLET | Freq: Two times a day (BID) | ORAL | 0 refills | Status: AC

## 2023-09-26 MED ORDER — ACETAMINOPHEN 500 MG PO TABS: 500.0000 mg | ORAL_TABLET | Freq: Four times a day (QID) | ORAL | Status: DC | PRN

## 2023-09-26 NOTE — ED Triage Notes (Signed)
Pt to ed from home via POV abdom pain x 1 week with eating food. Pt also has scratchy throat x 3 days. Pt denies fever but has chills. Pt has been nauseated with no vomiting. Pt is caox4, in no acute distress and ambulatory in triage.

## 2023-09-26 NOTE — Discharge Instructions (Addendum)
You have been diagnosed with sinus infection, gastritis.  Please take the antibiotic 1 tablet by mouth 2 times per day for 10 days.   Please take pantoprazole 20 minutes before breakfast.  Please take cough syrup 5 mL by mouth 4 times per day as needed.  Please come back to ED or make an appointment with your PCP if you have new symptoms or symptoms worsen.  Please take plenty of fluids.

## 2023-09-26 NOTE — ED Provider Notes (Signed)
[facility] Provider Note    Event Date/Time   First MD Initiated Contact with Patient 09/26/23 2156     (approximate)   History   Abdominal Pain   HPI  Theresa Stevens is a 38 y.o. female who presents today with history of cephalgia, nasal congestion, sore throat, and dry cough, chills.  Patient denies fever.  Also abdominal pain after meals associated with abdominal distention.  Patient has history of epigastric pain     Physical Exam   Triage Vital Signs: ED Triage Vitals [09/26/23 1839]  Encounter Vitals Group     BP (!) 143/89     Systolic BP Percentile      Diastolic BP Percentile      Pulse Rate (!) 120     Resp 18     Temp 98 F (36.7 C)     Temp Source Oral     SpO2 99 %     Weight      Height 5\' 3"  (1.6 m)     Head Circumference      Peak Flow      Pain Score 8     Pain Loc      Pain Education      Exclude from Growth Chart     Most recent vital signs: Vitals:   09/26/23 1839  BP: (!) 143/89  Pulse: (!) 120  Resp: 18  Temp: 98 F (36.7 C)  SpO2: 99%     Constitutional: Alert mild distress Eyes: Conjunctivae are normal.  Head: Atraumatic.  Tender to palpation in frontal paranasal area Nose: No congestion/rhinnorhea. Mouth/Throat: Mucous membranes are moist.   Neck: Painless ROM.  Cardiovascular:   Good peripheral circulation. Respiratory: Normal respiratory effort.  No retractions.  No wheezing Gastrointestinal: Soft, distended and no tender to deep palpation Musculoskeletal:  no deformity Neurologic:  MAE spontaneously. No gross focal neurologic deficits are appreciated.  Skin:  Skin is warm, dry and intact. No rash noted. Psychiatric: Mood and affect are normal. Speech and behavior are normal.    ED Results / Procedures / Treatments   Labs (all labs ordered are listed, but only abnormal results are displayed) Labs Reviewed  BASIC METABOLIC PANEL - Abnormal; Notable for the following components:      Result  Value   Glucose, Bld 124 (*)    All other components within normal limits  CBC WITH DIFFERENTIAL/PLATELET - Abnormal; Notable for the following components:   WBC 11.9 (*)    Neutro Abs 9.2 (*)    All other components within normal limits  URINALYSIS, ROUTINE W REFLEX MICROSCOPIC - Abnormal; Notable for the following components:   Color, Urine YELLOW (*)    APPearance CLEAR (*)    All other components within normal limits  RESP PANEL BY RT-PCR (RSV, FLU A&B, COVID)  RVPGX2  GROUP A STREP BY PCR  LIPASE, BLOOD  PREGNANCY, URINE  POC URINE PREG, ED     EKG     RADIOLOGY I independently reviewed and interpreted imaging and agree with radiologists findings.      PROCEDURES:  Critical Care performed:   Procedures   MEDICATIONS ORDERED IN ED: Medications  predniSONE (DELTASONE) tablet 60 mg (has no administration in time range)  HYDROcodone-acetaminophen (NORCO/VICODIN) 5-325 MG per tablet 2 tablet (has no administration in time range)  acetaminophen (TYLENOL) tablet 500 mg (has no administration in time range)     IMPRESSION / MDM / ASSESSMENT AND PLAN / ED COURSE  I reviewed the triage vital signs and the nursing notes.  Differential diagnosis includes, but is not limited to, sinusitis, COVID, influenza, RSV, epigastric pain, duodenal ulcer  Patient's presentation is most consistent with acute complicated illness / injury requiring diagnostic workup.   Patient's diagnosis is consistent with sinus infection, epigastric pain. . Labs are rea reassuring. I did review the patient's allergies and medications. Patient will be discharged home with prescriptions for Augmentin, pantoprazole, ibuprofen. Patient is to follow up with PCP as needed or otherwise directed. Patient is given ED precautions to return to the ED for any worsening or new symptoms. Discussed plan of care with patient, answered all of patient's questions, Patient agreeable to plan of care. Advised patient to  take medications according to the instructions on the label. Discussed possible side effects of new medications. Patient verbalized understanding.     FINAL CLINICAL IMPRESSION(S) / ED DIAGNOSES   Final diagnoses:  Acute frontal sinusitis, recurrence not specified  Other acute gastritis without hemorrhage     Rx / DC Orders   ED Discharge Orders          Ordered    amoxicillin-clavulanate (AUGMENTIN XR) 1000-62.5 MG 12 hr tablet  2 times daily        09/26/23 2219    pantoprazole (PROTONIX) 40 MG tablet  Daily        09/26/23 2219    brompheniramine-pseudoephedrine-DM 30-2-10 MG/5ML syrup  4 times daily PRN        09/26/23 2220             Note:  This document was prepared using Dragon voice recognition software and may include unintentional dictation errors.   Gladys Damme, PA-C 09/26/23 2222    Jene Every, MD 09/26/23 [phone]

## 2023-09-26 NOTE — ED Provider Triage Note (Signed)
Emergency Medicine Provider Triage Evaluation Note  Theresa Stevens , a 37 y.o. female  was evaluated in triage.  Pt complains of sore throat and headache. For a week she has had abdominal bloating and pain on the right side. She feels like she may have reflux as she has tingling in her throat, it is itchy. She has had a dry cough, that she cannot get rid of.   Review of Systems  Positive: See above, nausea, chills Negative: Fever, diarrhea  Physical Exam  There were no vitals taken for this visit. Gen:   Awake, no distress   Resp:  Normal effort  MSK:   Moves extremities without difficulty  Other:    Medical Decision Making  Medically screening exam initiated at 6:36 PM.  Appropriate orders placed.  Brandis R Kelly was informed that the remainder of the evaluation will be completed by another provider, this initial triage assessment does not replace that evaluation, and the importance of remaining in the ED until their evaluation is complete.     Cameron Ali, PA-C 09/26/23 1839

## 2023-09-29 ENCOUNTER — Emergency Department (HOSPITAL_COMMUNITY)
Admission: EM | Admit: 2023-09-29 | Discharge: 2023-09-29 | Disposition: A | Discharge status: Home / Self Care | Payer: Medicaid Other | Attending: Emergency Medicine | Admitting: Emergency Medicine

## 2023-09-29 ENCOUNTER — Emergency Department (HOSPITAL_COMMUNITY): Payer: Medicaid Other

## 2023-09-29 ENCOUNTER — Encounter (HOSPITAL_COMMUNITY): Payer: Self-pay | Admitting: Emergency Medicine

## 2023-09-29 ENCOUNTER — Other Ambulatory Visit: Payer: Self-pay

## 2023-09-29 DIAGNOSIS — J209 Acute bronchitis, unspecified: Secondary | ICD-10-CM | POA: Diagnosis not present

## 2023-09-29 DIAGNOSIS — E876 Hypokalemia: Secondary | ICD-10-CM | POA: Insufficient documentation

## 2023-09-29 DIAGNOSIS — R000 Tachycardia, unspecified: Secondary | ICD-10-CM | POA: Diagnosis not present

## 2023-09-29 DIAGNOSIS — R059 Cough, unspecified: Secondary | ICD-10-CM | POA: Diagnosis present

## 2023-09-29 DIAGNOSIS — Z20822 Contact with and (suspected) exposure to covid-19: Secondary | ICD-10-CM | POA: Insufficient documentation

## 2023-09-29 LAB — CBC
HCT: 40 % (ref 36.0–46.0)
Hemoglobin: 13.5 g/dL (ref 12.0–15.0)
MCH: 27.8 pg (ref 26.0–34.0)
MCHC: 33.8 g/dL (ref 30.0–36.0)
MCV: 82.5 fL (ref 80.0–100.0)
Platelets: 379 10*3/uL (ref 150–400)
RBC: 4.85 MIL/uL (ref 3.87–5.11)
RDW: 13.1 % (ref 11.5–15.5)
WBC: 12.5 10*3/uL — ABNORMAL HIGH (ref 4.0–10.5)
nRBC: 0 % (ref 0.0–0.2)

## 2023-09-29 LAB — BASIC METABOLIC PANEL
Anion gap: 10 (ref 5–15)
BUN: 7 mg/dL (ref 6–20)
CO2: 23 mmol/L (ref 22–32)
Calcium: 9.2 mg/dL (ref 8.9–10.3)
Chloride: 104 mmol/L (ref 98–111)
Creatinine, Ser: 0.69 mg/dL (ref 0.44–1.00)
GFR, Estimated: >60 mL/min (ref >60)
Glucose, Bld: 136 mg/dL — ABNORMAL HIGH (ref 70–99)
Potassium: 3.2 mmol/L — ABNORMAL LOW (ref 3.5–5.1)
Sodium: 137 mmol/L (ref 135–145)

## 2023-09-29 LAB — RESP PANEL BY RT-PCR (RSV, FLU A&B, COVID)  RVPGX2
Influenza A by PCR: NEGATIVE
Influenza B by PCR: NEGATIVE
Resp Syncytial Virus by PCR: NEGATIVE
SARS Coronavirus 2 by RT PCR: NEGATIVE

## 2023-09-29 LAB — D-DIMER, QUANTITATIVE: D-Dimer, Quant: 0.38 ug{FEU}/mL (ref 0.00–0.50)

## 2023-09-29 LAB — TROPONIN I (HIGH SENSITIVITY): Troponin I (High Sensitivity): 4 ng/L (ref <18)

## 2023-09-29 MED ORDER — HYDROCOD POLI-CHLORPHE POLI ER 10-8 MG/5ML PO SUER
5.0000 mL | Freq: Once | ORAL | Status: AC
  Administered 2023-09-29: 5 mL via ORAL
  Filled 2023-09-29: qty 5

## 2023-09-29 MED ORDER — PREDNISONE 20 MG PO TABS: 40.0000 mg | ORAL_TABLET | Freq: Every day | ORAL | 0 refills | Status: AC

## 2023-09-29 MED ORDER — MORPHINE SULFATE (PF) 4 MG/ML IV SOLN
4.0000 mg | Freq: Once | INTRAVENOUS | Status: AC
  Administered 2023-09-29: 4 mg via INTRAVENOUS
  Filled 2023-09-29: qty 1

## 2023-09-29 MED ORDER — POTASSIUM CHLORIDE CRYS ER 20 MEQ PO TBCR
40.0000 meq | EXTENDED_RELEASE_TABLET | Freq: Once | ORAL | Status: AC
Start: 2023-09-29 — End: 2023-09-29
  Administered 2023-09-29: 40 meq via ORAL
  Filled 2023-09-29: qty 2

## 2023-09-29 MED ORDER — SODIUM CHLORIDE 0.9 % IV BOLUS
1000.0000 mL | Freq: Once | INTRAVENOUS | Status: AC
  Administered 2023-09-29: 1000 mL via INTRAVENOUS

## 2023-09-29 MED ORDER — HYDROCOD POLI-CHLORPHE POLI ER 10-8 MG/5ML PO SUER: 5.0000 mL | Freq: Two times a day (BID) | ORAL | 0 refills | Status: AC | PRN

## 2023-09-29 NOTE — ED Triage Notes (Signed)
Pt presents for dry cough and sore throat x 2 weeks. Denies sick contacts. Endorses chest wall pain while coughing. Was treated at [facility] ED on 1/24 and was told that she has a viral infection. Resp swabs, strepm and bloodwork done at that visit, given Rx abx and cough syrup. Pt feels she needs a CXR.

## 2023-09-29 NOTE — ED Provider Triage Note (Addendum)
Emergency Medicine Provider Triage Evaluation Note  Theresa Stevens , a 38 y.o. female  was evaluated in triage.  Pt complains of cough x 2wks.  Review of Systems  Positive: Chest soreness, diarrhea, non-productive cough Negative: N/V, Abd pain, fever, chills, congestion, HA  Physical Exam  BP 111/89 (BP Location: Right Arm)   Pulse (!) 120   Temp 98.3 F (36.8 C) (Oral)   Resp 18   Wt 83.9 kg   LMP 09/03/2023   SpO2 98%   BMI 32.77 kg/m  Gen:   Awake, no distress   Resp:  Normal effort  MSK:   Moves extremities without difficulty  Other:    Medical Decision Making  Medically screening exam initiated at 2:19 PM.  Appropriate orders placed.  Theresa Stevens was informed that the remainder of the evaluation will be completed by another provider, this initial triage assessment does not replace that evaluation, and the importance of remaining in the ED until their evaluation is complete.  Labs and imaging ordered    Certified interpreter used during triage.  Dolphus Jenny, PA-C 09/29/23 1421    Dolphus Jenny, PA-C 09/29/23 1436

## 2023-09-29 NOTE — Discharge Instructions (Addendum)
Por favor, tome todo el tratamiento con esteroides que le hayan recetado, puede usar el jarabe para la tos medicado segn sea necesario, no le recomendara que conduzca si va a tomar el jarabe para la tos en descomposicin, ya que contiene un narctico. Por favor, regrese al departamento de emergencias si tiene un empeoramiento significativo de los sntomas a pesar del tratamiento. Es posible que pasen [employer] antes de que sus sntomas comiencen a Scientist, clinical (histocompatibility and immunogenetics) con el tratamiento que le hemos recetado.

## 2023-09-29 NOTE — ED Provider Notes (Signed)
[place] EMERGENCY DEPARTMENT AT [facility] Provider Note   CSN: [id number] Arrival date & time: 09/29/23  1253     History  Chief Complaint  Patient presents with   Cough    Theresa Stevens is a 38 y.o. female with past medical history significant for chronic hepatitis B infection, no other significant abnormalities who presents with concern for 2 weeks of sore throat, dry cough.  Denies any known sick contacts.  She endorses some chest pain worse with coughing.  She was seen at [facility] ED on 1/24, had respiratory swab, strep test, blood work, discharged with Augmentin, cough syrup.  She reports heart rate is elevated when she is exerting herself.  Cough not improving with medications.   Cough      Home Medications Prior to Admission medications   Medication Sig Start Date End Date Taking? Authorizing Provider  chlorpheniramine-HYDROcodone (TUSSIONEX) 10-8 MG/5ML Take 5 mLs by mouth every 12 (twelve) hours as needed for cough. 09/29/23  Yes Loriann Bosserman H, PA-C  predniSONE (DELTASONE) 20 MG tablet Take 2 tablets (40 mg total) by mouth daily. 09/29/23  Yes Kiera Hussey H, PA-C  amoxicillin-clavulanate (AUGMENTIN XR) 1000-62.5 MG 12 hr tablet Take 1 tablet by mouth 2 (two) times daily for 10 days. 09/26/23 10/06/23  Gladys Damme, PA-C  brompheniramine-pseudoephedrine-DM 30-2-10 MG/5ML syrup Take 5 mLs by mouth 4 (four) times daily as needed for up to 10 days. 09/26/23 10/06/23  Gladys Damme, PA-C  ibuprofen (ADVIL) 200 MG tablet Take 200 mg by mouth every 6 (six) hours as needed for mild pain.    [provider]  ondansetron (ZOFRAN-ODT) 4 MG disintegrating tablet Take 1 tablet (4 mg total) by mouth every 8 (eight) hours as needed for nausea or vomiting. 09/14/22   Chesley Noon, MD  pantoprazole (PROTONIX) 40 MG tablet Take 1 tablet (40 mg total) by mouth daily. 09/26/23   Gladys Damme, PA-C  spironolactone (ALDACTONE) 50 MG tablet Take by mouth.  07/16/22   [provider]      Allergies    Patient has no known allergies.    Review of Systems   Review of Systems  Respiratory:  Positive for cough.   All other systems reviewed and are negative.   Physical Exam Updated Vital Signs BP 121/83 (BP Location: Right Arm)   Pulse (!) 101   Temp 98.3 F (36.8 C) (Oral)   Resp 16   Wt 83.9 kg   LMP 09/03/2023   SpO2 100%   BMI 32.77 kg/m  Physical Exam Vitals and nursing note reviewed.  Constitutional:      General: She is not in acute distress.    Appearance: Normal appearance.  HENT:     Head: Normocephalic and atraumatic.  Eyes:     General:        Right eye: No discharge.        Left eye: No discharge.  Cardiovascular:     Rate and Rhythm: Regular rhythm. Tachycardia present.     Heart sounds: No murmur heard.    No friction rub. No gallop.  Pulmonary:     Effort: Pulmonary effort is normal.     Breath sounds: Normal breath sounds.     Comments: No wheezing, rhonchi, stridor, rales Abdominal:     General: Bowel sounds are normal.     Palpations: Abdomen is soft.  Skin:    General: Skin is warm and dry.     Capillary Refill: Capillary refill takes  less than 2 seconds.  Neurological:     Mental Status: She is alert and oriented to person, place, and time.  Psychiatric:        Mood and Affect: Mood normal.        Behavior: Behavior normal.     ED Results / Procedures / Treatments   Labs (all labs ordered are listed, but only abnormal results are displayed) Labs Reviewed  CBC - Abnormal; Notable for the following components:      Result Value   WBC 12.5 (*)    All other components within normal limits  BASIC METABOLIC PANEL - Abnormal; Notable for the following components:   Potassium 3.2 (*)    Glucose, Bld 136 (*)    All other components within normal limits  RESP PANEL BY RT-PCR (RSV, FLU A&B, COVID)  RVPGX2  D-DIMER, QUANTITATIVE  TROPONIN I (HIGH SENSITIVITY)    EKG EKG  Interpretation Date/Time:  Monday September 29 2023 13:57:30 EST Ventricular Rate:  125 PR Interval:  144 QRS Duration:  70 QT Interval:  304 QTC Calculation: 438 R Axis:   73  Text Interpretation: Sinus tachycardia Nonspecific ST abnormality Abnormal ECG When compared with ECG of 17-Aug-2023 16:03, PREVIOUS ECG IS PRESENT Confirmed by Glyn Ade [phone]) on 09/29/2023 6:34:01 PM  Radiology DG Chest 2 View Result Date: 09/29/2023 CLINICAL DATA:  Cough and shortness of breath for 2 weeks. EXAM: CHEST - 2 VIEW COMPARISON:  Radiograph and CT 08/17/2023 FINDINGS: The cardiomediastinal contours are normal. The lungs are clear. Pulmonary vasculature is normal. No consolidation, pleural effusion, or pneumothorax. No acute osseous abnormalities are seen. IMPRESSION: No active cardiopulmonary disease. Electronically Signed   By: Narda Rutherford M.D.   On: 09/29/2023 15:02    Procedures Procedures    Medications Ordered in ED Medications  potassium chloride SA (KLOR-CON M) CR tablet 40 mEq (has no administration in time range)  chlorpheniramine-HYDROcodone (TUSSIONEX) 10-8 MG/5ML suspension 5 mL (5 mLs Oral Given 09/29/23 1915)  morphine (PF) 4 MG/ML injection 4 mg (4 mg Intravenous Given 09/29/23 1916)  sodium chloride 0.9 % bolus 1,000 mL (1,000 mLs Intravenous New Bag/Given 09/29/23 1913)    ED Course/ Medical Decision Making/ A&P                                 Medical Decision Making  This patient is a 38 y.o. female  who presents to the ED for concern of cough, shortness of breath, chest pain.   Differential diagnoses prior to evaluation: The emergent differential diagnosis includes, but is not limited to,  asthma exacerbation, COPD exacerbation, acute upper respiratory infection, acute bronchitis, chronic bronchitis, interstitial lung disease, ARDS, PE, pneumonia, atypical ACS, carbon monoxide poisoning, spontaneous pneumothorax, new CHF vs CHF exacerbation, versus other,  Mallory-Weiss, Boerhaave's, acute bronchitis, anxiety, MSK pain or traumatic injury to the chest, acid reflux versus other . This is not an exhaustive differential.   Past Medical History / Co-morbidities / Social History: Hepatitis B, otherwise noncontributory  Physical Exam: Physical exam performed. The pertinent findings include: tachycardia on arrival, otherwise unremarkable  Lab Tests/Imaging studies: I personally interpreted labs/imaging and the pertinent results include: RVP is negative for COVID, flu, RSV, D-dimer negative.  BMP notable for mild hypokalemia, potassium 3.2, CBC is mildly elevated 12.5, troponin normal at 4.  No evidence of acute cardiac or pulmonary abnormality..  Independently interpreted plain, chest x-ray shows no evidence of acute  intrathoracic abnormality, no evidence of pneumonia.  I agree with the radiologist interpretation.  Cardiac monitoring: EKG obtained and interpreted by myself and attending physician which shows: Sinus tachycardia   Medications: I ordered medication including fluid bolus, morphine, medicated cough syrup.  Potassium for hypokalemia.  I have reviewed the patients home medicines and have made adjustments as needed.   Disposition: After consideration of the diagnostic results and the patients response to treatment, I feel that patient with postviral cough syndrome/bronchitis, will treat with medicated cough syrup and steroids.   emergency department workup does not suggest an emergent condition requiring admission or immediate intervention beyond what has been performed at this time. The plan is: as above. The patient is safe for discharge and has been instructed to return immediately for worsening symptoms, change in symptoms or any other concerns.  Final Clinical Impression(s) / ED Diagnoses Final diagnoses:  Acute bronchitis, unspecified organism    Rx / DC Orders ED Discharge Orders          Ordered    predniSONE (DELTASONE) 20 MG  tablet  Daily        09/29/23 2021    chlorpheniramine-HYDROcodone (TUSSIONEX) 10-8 MG/5ML  Every 12 hours PRN        09/29/23 2021              [place] 09/29/23 2021    Glyn Ade, MD 09/29/23 2252

## 2023-10-02 ENCOUNTER — Other Ambulatory Visit: Payer: Self-pay

## 2023-10-02 ENCOUNTER — Emergency Department: Payer: No Typology Code available for payment source

## 2023-10-02 ENCOUNTER — Emergency Department
Admission: EM | Admit: 2023-10-02 | Discharge: 2023-10-02 | Disposition: A | Discharge status: Home / Self Care | Payer: No Typology Code available for payment source | Attending: Emergency Medicine | Admitting: Emergency Medicine

## 2023-10-02 ENCOUNTER — Encounter: Payer: Self-pay | Admitting: *Deleted

## 2023-10-02 DIAGNOSIS — M25552 Pain in left hip: Secondary | ICD-10-CM | POA: Insufficient documentation

## 2023-10-02 DIAGNOSIS — I10 Essential (primary) hypertension: Secondary | ICD-10-CM | POA: Insufficient documentation

## 2023-10-02 DIAGNOSIS — R0789 Other chest pain: Secondary | ICD-10-CM | POA: Insufficient documentation

## 2023-10-02 DIAGNOSIS — M542 Cervicalgia: Secondary | ICD-10-CM | POA: Diagnosis not present

## 2023-10-02 DIAGNOSIS — M25512 Pain in left shoulder: Secondary | ICD-10-CM | POA: Insufficient documentation

## 2023-10-02 DIAGNOSIS — Y92410 Unspecified street and highway as the place of occurrence of the external cause: Secondary | ICD-10-CM | POA: Insufficient documentation

## 2023-10-02 DIAGNOSIS — R519 Headache, unspecified: Secondary | ICD-10-CM | POA: Insufficient documentation

## 2023-10-02 LAB — POC URINE PREG, ED: Preg Test, Ur: NEGATIVE

## 2023-10-02 MED ORDER — KETOROLAC TROMETHAMINE 30 MG/ML IJ SOLN
30.0000 mg | Freq: Once | INTRAMUSCULAR | Status: AC
  Administered 2023-10-02: 30 mg via INTRAMUSCULAR
  Filled 2023-10-02 (×2): qty 1

## 2023-10-02 MED ORDER — IBUPROFEN 400 MG PO TABS: 400.0000 mg | ORAL_TABLET | Freq: Four times a day (QID) | ORAL | 0 refills | Status: AC | PRN
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
## 2024-04-15 ENCOUNTER — Emergency Department

## 2024-04-15 ENCOUNTER — Other Ambulatory Visit: Payer: Self-pay

## 2024-04-15 ENCOUNTER — Emergency Department
Admission: EM | Admit: 2024-04-15 | Discharge: 2024-04-15 | Disposition: A | Discharge status: Home / Self Care | Attending: Emergency Medicine | Admitting: Emergency Medicine

## 2024-04-15 DIAGNOSIS — R102 Pelvic and perineal pain: Secondary | ICD-10-CM | POA: Diagnosis present

## 2024-04-15 LAB — URINALYSIS, ROUTINE W REFLEX MICROSCOPIC
Bacteria, UA: NONE SEEN
Bilirubin Urine: NEGATIVE
Glucose, UA: NEGATIVE mg/dL
Hgb urine dipstick: NEGATIVE
Ketones, ur: NEGATIVE mg/dL
Nitrite: NEGATIVE
Protein, ur: NEGATIVE mg/dL
Specific Gravity, Urine: 1.018 (ref 1.005–1.030)
pH: 6 (ref 5.0–8.0)

## 2024-04-15 LAB — BASIC METABOLIC PANEL WITH GFR
Anion gap: 8 (ref 5–15)
BUN: 16 mg/dL (ref 6–20)
CO2: 24 mmol/L (ref 22–32)
Calcium: 9 mg/dL (ref 8.9–10.3)
Chloride: 106 mmol/L (ref 98–111)
Creatinine, Ser: 0.57 mg/dL (ref 0.44–1.00)
GFR, Estimated: >60 mL/min (ref >60)
Glucose, Bld: 119 mg/dL — ABNORMAL HIGH (ref 70–99)
Potassium: 3.6 mmol/L (ref 3.5–5.1)
Sodium: 138 mmol/L (ref 135–145)

## 2024-04-15 LAB — CHLAMYDIA/NGC RT PCR (ARMC ONLY)
Chlamydia Tr: NOT DETECTED
N gonorrhoeae: NOT DETECTED

## 2024-04-15 LAB — CBC WITH DIFFERENTIAL/PLATELET
Abs Immature Granulocytes: 0.03 K/uL (ref 0.00–0.07)
Basophils Absolute: 0.1 K/uL (ref 0.0–0.1)
Basophils Relative: 1 %
Eosinophils Absolute: 0.3 K/uL (ref 0.0–0.5)
Eosinophils Relative: 3 %
HCT: 40.1 % (ref 36.0–46.0)
Hemoglobin: 13.2 g/dL (ref 12.0–15.0)
Immature Granulocytes: 0 %
Lymphocytes Relative: 31 %
Lymphs Abs: 4 K/uL (ref 0.7–4.0)
MCH: 28.1 pg (ref 26.0–34.0)
MCHC: 32.9 g/dL (ref 30.0–36.0)
MCV: 85.3 fL (ref 80.0–100.0)
Monocytes Absolute: 0.8 K/uL (ref 0.1–1.0)
Monocytes Relative: 6 %
Neutro Abs: 7.8 K/uL — ABNORMAL HIGH (ref 1.7–7.7)
Neutrophils Relative %: 59 %
Platelets: 366 K/uL (ref 150–400)
RBC: 4.7 MIL/uL (ref 3.87–5.11)
RDW: 13.5 % (ref 11.5–15.5)
WBC: 12.9 K/uL — ABNORMAL HIGH (ref 4.0–10.5)
nRBC: 0 % (ref 0.0–0.2)

## 2024-04-15 LAB — LIPASE, BLOOD: Lipase: 29 U/L (ref 11–51)

## 2024-04-15 LAB — HEPATIC FUNCTION PANEL
ALT: 13 U/L (ref 0–44)
AST: 14 U/L — ABNORMAL LOW (ref 15–41)
Albumin: 3.5 g/dL (ref 3.5–5.0)
Alkaline Phosphatase: 67 U/L (ref 38–126)
Bilirubin, Direct: <0.1 mg/dL (ref 0.0–0.2)
Total Bilirubin: 0.5 mg/dL (ref 0.0–1.2)
Total Protein: 6.5 g/dL (ref 6.5–8.1)

## 2024-04-15 LAB — WET PREP, GENITAL
Clue Cells Wet Prep HPF POC: NONE SEEN
Sperm: NONE SEEN
Trich, Wet Prep: NONE SEEN
WBC, Wet Prep HPF POC: <10 (ref <10)
Yeast Wet Prep HPF POC: NONE SEEN

## 2024-04-15 LAB — POC URINE PREG, ED: Preg Test, Ur: NEGATIVE

## 2024-04-15 LAB — HCG, QUANTITATIVE, PREGNANCY: hCG, Beta Chain, Quant, S: 1 m[IU]/mL (ref <5)

## 2024-04-15 MED ORDER — IOHEXOL 300 MG/ML  SOLN
100.0000 mL | Freq: Once | INTRAMUSCULAR | Status: AC | PRN
  Administered 2024-04-15: 100 mL via INTRAVENOUS

## 2024-04-15 MED ORDER — KETOROLAC TROMETHAMINE 15 MG/ML IJ SOLN
15.0000 mg | Freq: Once | INTRAMUSCULAR | Status: AC
  Administered 2024-04-15: 15 mg via INTRAMUSCULAR
  Filled 2024-04-15: qty 1

## 2024-04-15 NOTE — ED Provider Notes (Signed)
 [facility] Provider Note    Event Date/Time   First MD Initiated Contact with Patient 04/15/24 [phone]     (approximate)   History   Pelvic Pain   HPI  Theresa Stevens is a 38 y.o. female who presents today for evaluation of suprapubic abdominal discomfort and right lower quadrant discomfort that began yesterday.  Patient reports that it feels like a pressure sensation.  She reports that this happened approximately 2 months ago as well, but went away spontaneously and she was not medically evaluated.  She denies any burning with urination.  No vaginal discharge or bleeding.  She reports that the pain also wraps around to her right flank area.  No fevers or chills.  No vomiting.  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/2022   Chronic hepatitis B virus infection (HCC) 04/29/2019   Muscle strain of left scapular region 06/23/2017   Tendinitis of upper biceps tendon of left shoulder 06/09/2017   Rotator cuff tendinitis, left 06/09/2017   Influenza 09/06/2016          Physical Exam   Triage Vital Signs: ED Triage Vitals  Encounter Vitals Group     BP 04/15/24 0510 118/85     Girls Systolic BP Percentile --      Girls Diastolic BP Percentile --      Boys Systolic BP Percentile --      Boys Diastolic BP Percentile --      Pulse Rate 04/15/24 0510 (!) 111     Resp 04/15/24 0510 18     Temp 04/15/24 0514 98.2 F (36.8 C)     Temp src --      SpO2 04/15/24 0510 100 %     Weight 04/15/24 0509 180 lb (81.6 kg)     Height 04/15/24 0509 5' 3 (1.6 m)     Head Circumference --      Peak Flow --      Pain Score 04/15/24 0509 8     Pain Loc --      Pain Education --      Exclude from Growth Chart --     Most recent vital signs: Vitals:   04/15/24 0929 04/15/24 1409  BP: 120/80 122/78  Pulse: 100 88  Resp: 18 16  Temp: 98 F (36.7 C) 98 F (36.7 C)  SpO2: 100% 100%    Physical Exam Vitals and  nursing note reviewed.  Constitutional:      General: Awake and alert. No acute distress.    Appearance: Normal appearance. The patient is normal weight.  HENT:     Head: Normocephalic and atraumatic.     Mouth: Mucous membranes are moist.  Eyes:     General: PERRL. Normal EOMs        Right eye: No discharge.        Left eye: No discharge.     Conjunctiva/sclera: Conjunctivae normal.  Cardiovascular:     Rate and Rhythm: Normal rate and regular rhythm.     Pulses: Normal pulses.  Pulmonary:     Effort: Pulmonary effort is normal. No respiratory distress.     Breath sounds: Normal breath sounds.  Abdominal:     Abdomen is soft. There is suprapubic and right lower quadrant abdominal tenderness. No rebound or guarding. No distention.  No CVA tenderness Musculoskeletal:        General: No swelling. Normal range of motion.  Cervical back: Normal range of motion and neck supple.  Skin:    General: Skin is warm and dry.     Capillary Refill: Capillary refill takes less than 2 seconds.     Findings: No rash.  Neurological:     Mental Status: The patient is awake and alert.      ED Results / Procedures / Treatments   Labs (all labs ordered are listed, but only abnormal results are displayed) Labs Reviewed  CBC WITH DIFFERENTIAL/PLATELET - Abnormal; Notable for the following components:      Result Value   WBC 12.9 (*)    Neutro Abs 7.8 (*)    All other components within normal limits  BASIC METABOLIC PANEL WITH GFR - Abnormal; Notable for the following components:   Glucose, Bld 119 (*)    All other components within normal limits  URINALYSIS, ROUTINE W REFLEX MICROSCOPIC - Abnormal; Notable for the following components:   Color, Urine YELLOW (*)    APPearance CLEAR (*)    Leukocytes,Ua TRACE (*)    All other components within normal limits  HEPATIC FUNCTION PANEL - Abnormal; Notable for the following components:   AST 14 (*)    All other components within normal limits   WET PREP, GENITAL  CHLAMYDIA/NGC RT PCR ([facility] ONLY)            LIPASE, BLOOD  HCG, QUANTITATIVE, PREGNANCY  POC URINE PREG, ED     EKG     RADIOLOGY I independently reviewed and interpreted imaging and agree with radiologists findings.     PROCEDURES:  Critical Care performed:   Procedures   MEDICATIONS ORDERED IN ED: Medications  ketorolac  (TORADOL ) 15 MG/ML injection 15 mg (15 mg Intramuscular Given 04/15/24 0933)  iohexol  (OMNIPAQUE ) 300 MG/ML solution 100 mL (100 mLs Intravenous Contrast Given 04/15/24 1144)     IMPRESSION / MDM / ASSESSMENT AND PLAN / ED COURSE  I reviewed the triage vital signs and the nursing notes.   Differential diagnosis includes, but is not limited to, urinary tract infection, pyelonephritis, nephrolithiasis, ovarian cyst, ovarian torsion, appendicitis.  Patient is awake and alert, tachycardic on arrival to normotensive and afebrile.  She appears to be mildly uncomfortable.  Further workup is indicated.  Labs obtained in triage reveal mild leukocytosis to 12.9.  Urinalysis does not demonstrate evidence of infection, pregnancy is negative.  Will obtain pelvic ultrasound for further evaluation.  Pelvic ultrasound is normal.  CT scan obtained for further evaluation, and this is also normal.  Wet prep/gonorrhea/chlamydia obtained as well and these are normal.  Unclear etiology of her pain today, though nothing appears to be acutely surgical or emergent.  I recommended outpatient follow-up.  We discussed return precautions in the meantime.  Patient understands and agrees with plan.  Discharged in stable condition.   Patient's presentation is most consistent with acute complicated illness / injury requiring diagnostic workup.   Clinical Course as of 04/15/24 1442  Thu Apr 15, 2024  1225 Patient reports that pain has improved [JP]    Clinical Course User Index [JP] Sary Bogie E, PA-C     FINAL CLINICAL IMPRESSION(S) / ED DIAGNOSES    Final diagnoses:  Pelvic pain in female     Rx / DC Orders   ED Discharge Orders     None        Note:  This document was prepared using Dragon voice recognition software and may include unintentional dictation errors.   Ursula Dermody E, PA-C  04/15/24 1443    Ernest Ronal BRAVO, MD 04/16/24 [phone]

## 2024-04-15 NOTE — Discharge Instructions (Signed)
 Your CT scan, ultrasound, blood work, swabs, and urine are normal today.  Please follow-up with your outpatient provider.  Please return for any new, worsening, or change in symptoms or other concerns.  It was a pleasure caring for you today.

## 2024-04-15 NOTE — ED Triage Notes (Signed)
 Pt reports pelvic pain that began yesterday, pt denies vaginal bleeding or abnormal discharge.

## 2024-04-15 NOTE — ED Notes (Signed)
 See triage note  Presents with pelvic pain  States pain started yesterday  Afebrile on arrival  Denies any vaginal discharge or bleeding
